# Patient Record
Sex: Female | Born: 1945 | Race: White | Hispanic: No | Marital: Single | Smoking: Never smoker
Health system: Southern US, Community
[De-identification: ages and names within clinical notes are randomized; demographics above are authoritative.]

## PROBLEM LIST (undated history)

## (undated) DIAGNOSIS — E7800 Pure hypercholesterolemia, unspecified: Secondary | ICD-10-CM

## (undated) DIAGNOSIS — R112 Nausea with vomiting, unspecified: Secondary | ICD-10-CM

## (undated) DIAGNOSIS — Z9889 Other specified postprocedural states: Secondary | ICD-10-CM

## (undated) HISTORY — PX: EYE SURGERY: SHX253

## (undated) HISTORY — PX: OVARIAN CYST SURGERY: SHX726

## (undated) HISTORY — PX: COLONOSCOPY: SHX174

---

## 1999-04-11 ENCOUNTER — Other Ambulatory Visit: Admission: RE | Admit: 1999-04-11 | Discharge: 1999-04-11 | Payer: Self-pay | Admitting: Obstetrics and Gynecology

## 1999-08-01 ENCOUNTER — Encounter (INDEPENDENT_AMBULATORY_CARE_PROVIDER_SITE_OTHER): Payer: Self-pay | Admitting: Specialist

## 1999-08-01 ENCOUNTER — Ambulatory Visit (HOSPITAL_COMMUNITY): Admission: RE | Admit: 1999-08-01 | Discharge: 1999-08-01 | Payer: Self-pay | Admitting: Gastroenterology

## 2000-05-08 ENCOUNTER — Other Ambulatory Visit: Admission: RE | Admit: 2000-05-08 | Discharge: 2000-05-08 | Payer: Self-pay | Admitting: Obstetrics and Gynecology

## 2004-06-06 ENCOUNTER — Other Ambulatory Visit: Admission: RE | Admit: 2004-06-06 | Discharge: 2004-06-06 | Payer: Self-pay | Admitting: Obstetrics and Gynecology

## 2004-07-05 ENCOUNTER — Ambulatory Visit (HOSPITAL_COMMUNITY): Admission: RE | Admit: 2004-07-05 | Discharge: 2004-07-05 | Payer: Self-pay | Admitting: Gastroenterology

## 2006-06-16 ENCOUNTER — Other Ambulatory Visit: Admission: RE | Admit: 2006-06-16 | Discharge: 2006-06-16 | Payer: Self-pay | Admitting: Obstetrics and Gynecology

## 2008-06-30 ENCOUNTER — Other Ambulatory Visit: Admission: RE | Admit: 2008-06-30 | Discharge: 2008-06-30 | Payer: Self-pay | Admitting: Obstetrics and Gynecology

## 2009-07-17 ENCOUNTER — Other Ambulatory Visit: Admission: RE | Admit: 2009-07-17 | Discharge: 2009-07-17 | Payer: Self-pay | Admitting: Obstetrics and Gynecology

## 2010-07-09 ENCOUNTER — Emergency Department (HOSPITAL_COMMUNITY): Admission: EM | Admit: 2010-07-09 | Discharge: 2010-07-09 | Payer: Self-pay | Admitting: Emergency Medicine

## 2010-07-17 ENCOUNTER — Other Ambulatory Visit: Admission: RE | Admit: 2010-07-17 | Discharge: 2010-07-17 | Payer: Self-pay | Admitting: Obstetrics and Gynecology

## 2011-02-15 LAB — COMPREHENSIVE METABOLIC PANEL
AST: 26 U/L (ref 0–37)
CO2: 28 mEq/L (ref 19–32)
Calcium: 9.4 mg/dL (ref 8.4–10.5)
Creatinine, Ser: 0.65 mg/dL (ref 0.4–1.2)
GFR calc Af Amer: >60 mL/min (ref >60)
GFR calc non Af Amer: >60 mL/min (ref >60)
Glucose, Bld: 97 mg/dL (ref 70–99)

## 2011-02-15 LAB — CK TOTAL AND CKMB (NOT AT ARMC)
CK, MB: 3 ng/mL (ref 0.3–4.0)
Total CK: 102 U/L (ref 7–177)

## 2011-02-15 LAB — CBC
Hemoglobin: 14.4 g/dL (ref 12.0–15.0)
MCHC: 34.6 g/dL (ref 30.0–36.0)
RBC: 4.62 MIL/uL (ref 3.87–5.11)
WBC: 5.3 10*3/uL (ref 4.0–10.5)

## 2011-02-15 LAB — DIFFERENTIAL
Basophils Absolute: 0.1 10*3/uL (ref 0.0–0.1)
Basophils Relative: 1 % (ref 0–1)
Monocytes Absolute: 0.4 10*3/uL (ref 0.1–1.0)
Neutro Abs: 3.1 10*3/uL (ref 1.7–7.7)

## 2011-04-19 NOTE — Op Note (Signed)
NAME:  Margaret Fleming, Margaret Fleming                            ACCOUNT NO.:  [id number]   MEDICAL RECORD NO.:  [id number]                   PATIENT TYPE:  AMB   LOCATION:  ENDO                                 FACILITY:  [facility]   PHYSICIAN:  Bernette Redbird, M.D.                DATE OF BIRTH:  October 07, 1946   DATE OF PROCEDURE:  07/05/2004  DATE OF DISCHARGE:                                 OPERATIVE REPORT   PROCEDURE:  Colonoscopy.   INDICATIONS:  Prior history of diminutive adenomatous polyp, having been  removed from this patient's colon about 5 years ago.   FINDINGS:  Normal examination.  Slightly poor prep.   DESCRIPTION OF PROCEDURE:  The nature, purpose and risks of the procedure  were familiar to the patient from prior examination.  She provided written  consent.  Sedation is fentanyl 60 mcg and Versed 7.5 mg IV, without  arrhythmias or desaturation.   The Olympus adjustable-tension pediatric video colonoscope was advanced with  mild difficulty due to looping, overcome by external abdominal compression  with the patient in the supine position.  The cecum was reached, as  evidenced by typical cecal appearance and the absence of further lumen.  Pullback was then performed.   In the proximal ascending colon, there was a fair amount of semiformed  vegetable debris, issuing forth from the ileocecal valve and into the cecum  and proximal colon.  This could not readily be suctioned up because of a  clogged scope.  I examined the proximal colon, then turned the patient back  into the left lateral decubitus position to shift the puddle, and re-examine  the area without findings.  Pullback was then performed.  There were a few  other puddles here and there in the colon, that could not readily be  suctioned up in their entirety, but it is felt that it is unlikely any large  or significant lesions would have been missed during this examination.  Retroflexion of the rectum and pullout through the anal  canal were  unremarkable.  No polyps, cancer, colitis, vascular malformations or  diverticulosis were noted; and, no biopsies were obtained.   The patient tolerated the procedure well and there were no apparent  complications.   IMPRESSION:  Prior history of colonic adenoma, without worrisome findings on  current examination.   PLAN:  Follow-up colonoscopy in 5 years.                                               Bernette Redbird, M.D.    RB/MEDQ  D:  07/05/2004  T:  07/06/2004  Job:  [id number]   cc:   Lilla Shook, M.D.  [address], [address]  [place]  [place] [address]  Fax: [phone]  Artist Pais, M.D.  [address], [address]  [place]  [place] [address]  Fax: [phone]

## 2012-11-04 ENCOUNTER — Other Ambulatory Visit: Payer: Self-pay | Admitting: Pharmacist

## 2013-07-01 ENCOUNTER — Other Ambulatory Visit (HOSPITAL_COMMUNITY)
Admission: RE | Admit: 2013-07-01 | Discharge: 2013-07-01 | Disposition: A | Discharge status: Home / Self Care | Payer: BC Managed Care – PPO | Source: Ambulatory Visit | Attending: Obstetrics and Gynecology | Admitting: Obstetrics and Gynecology

## 2013-07-01 ENCOUNTER — Other Ambulatory Visit: Payer: Self-pay | Admitting: Nurse Practitioner

## 2013-07-01 DIAGNOSIS — Z01419 Encounter for gynecological examination (general) (routine) without abnormal findings: Secondary | ICD-10-CM | POA: Insufficient documentation

## 2013-07-01 DIAGNOSIS — Z1151 Encounter for screening for human papillomavirus (HPV): Secondary | ICD-10-CM | POA: Insufficient documentation

## 2015-12-10 ENCOUNTER — Emergency Department (HOSPITAL_COMMUNITY): Payer: BC Managed Care – PPO

## 2015-12-10 ENCOUNTER — Observation Stay (HOSPITAL_COMMUNITY)
Admission: EM | Admit: 2015-12-10 | Discharge: 2015-12-12 | Disposition: A | Discharge status: Home / Self Care | Payer: BC Managed Care – PPO | Attending: Internal Medicine | Admitting: Internal Medicine

## 2015-12-10 ENCOUNTER — Encounter (HOSPITAL_COMMUNITY): Payer: Self-pay

## 2015-12-10 DIAGNOSIS — W000XXA Fall on same level due to ice and snow, initial encounter: Secondary | ICD-10-CM | POA: Insufficient documentation

## 2015-12-10 DIAGNOSIS — M25511 Pain in right shoulder: Secondary | ICD-10-CM | POA: Diagnosis present

## 2015-12-10 DIAGNOSIS — E785 Hyperlipidemia, unspecified: Secondary | ICD-10-CM | POA: Diagnosis not present

## 2015-12-10 DIAGNOSIS — S42231A 3-part fracture of surgical neck of right humerus, initial encounter for closed fracture: Secondary | ICD-10-CM | POA: Diagnosis not present

## 2015-12-10 DIAGNOSIS — S42309A Unspecified fracture of shaft of humerus, unspecified arm, initial encounter for closed fracture: Secondary | ICD-10-CM | POA: Diagnosis present

## 2015-12-10 DIAGNOSIS — Z79899 Other long term (current) drug therapy: Secondary | ICD-10-CM | POA: Insufficient documentation

## 2015-12-10 DIAGNOSIS — I10 Essential (primary) hypertension: Secondary | ICD-10-CM | POA: Insufficient documentation

## 2015-12-10 DIAGNOSIS — S4291XA Fracture of right shoulder girdle, part unspecified, initial encounter for closed fracture: Secondary | ICD-10-CM

## 2015-12-10 DIAGNOSIS — S4290XA Fracture of unspecified shoulder girdle, part unspecified, initial encounter for closed fracture: Secondary | ICD-10-CM | POA: Diagnosis present

## 2015-12-10 LAB — CBC
HCT: 38.2 % (ref 36.0–46.0)
Hemoglobin: 12.9 g/dL (ref 12.0–15.0)
MCH: 30.5 pg (ref 26.0–34.0)
MCHC: 33.8 g/dL (ref 30.0–36.0)
MCV: 90.3 fL (ref 78.0–100.0)
PLATELETS: 167 10*3/uL (ref 150–400)
RBC: 4.23 MIL/uL (ref 3.87–5.11)
RDW: 12.6 % (ref 11.5–15.5)
WBC: 11.1 10*3/uL — AB (ref 4.0–10.5)

## 2015-12-10 LAB — CREATININE, SERUM: Creatinine, Ser: 0.61 mg/dL (ref 0.44–1.00)

## 2015-12-10 LAB — TSH: TSH: 0.9 u[IU]/mL (ref 0.350–4.500)

## 2015-12-10 MED ORDER — ENOXAPARIN SODIUM 40 MG/0.4ML ~~LOC~~ SOLN
40.0000 mg | SUBCUTANEOUS | Status: DC
  Administered 2015-12-10 – 2015-12-11 (×2): 40 mg via SUBCUTANEOUS
  Filled 2015-12-10 (×3): qty 0.4

## 2015-12-10 MED ORDER — OXYCODONE HCL 5 MG PO TABS
5.0000 mg | ORAL_TABLET | ORAL | Status: DC | PRN
  Administered 2015-12-10 (×2): 5 mg via ORAL
  Filled 2015-12-10 (×2): qty 1

## 2015-12-10 MED ORDER — HYDROMORPHONE HCL 1 MG/ML IJ SOLN
0.5000 mg | INTRAMUSCULAR | Status: DC | PRN
  Administered 2015-12-10: 0.5 mg via INTRAVENOUS
  Filled 2015-12-10: qty 1

## 2015-12-10 MED ORDER — HYDROMORPHONE HCL 1 MG/ML IJ SOLN: 1.0000 mg | INTRAMUSCULAR | Status: DC | PRN

## 2015-12-10 MED ORDER — HYDROMORPHONE HCL 1 MG/ML IJ SOLN: 0.5000 mg | INTRAMUSCULAR | Status: DC | PRN

## 2015-12-10 MED ORDER — IBUPROFEN 200 MG PO TABS
600.0000 mg | ORAL_TABLET | Freq: Once | ORAL | Status: AC
  Administered 2015-12-10: 600 mg via ORAL
  Filled 2015-12-10: qty 3

## 2015-12-10 MED ORDER — ONDANSETRON HCL 4 MG/2ML IJ SOLN: 4.0000 mg | Freq: Four times a day (QID) | INTRAMUSCULAR | Status: DC | PRN

## 2015-12-10 MED ORDER — ONDANSETRON HCL 4 MG PO TABS: 4.0000 mg | ORAL_TABLET | Freq: Four times a day (QID) | ORAL | Status: DC | PRN

## 2015-12-10 NOTE — ED Provider Notes (Signed)
CSN: [id number]     Arrival date & time 12/10/15  0907 History   First MD Initiated Contact with Patient 12/10/15 [phone]     Chief Complaint  Patient presents with  . Fall  . Shoulder Pain     HPI Patient fell on ice today and is now c/o right shoulder pain. History reviewed. No pertinent past medical history. History reviewed. No pertinent past surgical history. Family History  Problem Relation Age of Onset  . Heart failure Father    Social History  Substance Use Topics  . Smoking status: Never Smoker   . Smokeless tobacco: Never Used  . Alcohol Use: Yes     Comment: occasionally   OB History    No data available     Review of Systems  All other systems reviewed and are negative.     Allergies  Review of patient's allergies indicates no known allergies.  Home Medications   Prior to Admission medications   Medication Sig Start Date End Date Taking? Authorizing Provider  atorvastatin (LIPITOR) 40 MG tablet Take 40 mg by mouth every evening. 10/09/15  Yes Historical Provider, MD  ibuprofen (ADVIL,MOTRIN) 200 MG tablet Take 200-400 mg by mouth every 6 (six) hours as needed (pain).   Yes Historical Provider, MD  Multiple Vitamin (MULTIVITAMIN WITH MINERALS) TABS tablet Take 1 tablet by mouth daily.   Yes Historical Provider, MD   BP 127/72 mmHg  Pulse 68  Temp(Src) 97.8 F (36.6 C) (Oral)  Resp 16  Ht 5\' 4"  (1.626 m)  Wt 125 lb (56.7 kg)  BMI 21.45 kg/m2  SpO2 100% Physical Exam  Constitutional: She is oriented to person, place, and time. She appears well-developed and well-nourished. No distress.  HENT:  Head: Normocephalic and atraumatic.  Eyes: Pupils are equal, round, and reactive to light.  Neck: Normal range of motion.  Cardiovascular: Normal rate and intact distal pulses.   Pulmonary/Chest: No respiratory distress.  Abdominal: Normal appearance. She exhibits no distension.  Musculoskeletal:       Right shoulder: She exhibits decreased range of motion,  tenderness and pain. She exhibits no bony tenderness, no deformity, normal pulse and normal strength.  Neurological: She is alert and oriented to person, place, and time. No cranial nerve deficit.  Skin: Skin is warm and dry. No rash noted.  Psychiatric: She has a normal mood and affect. Her behavior is normal.  Nursing note and vitals reviewed.   ED Course  Procedures (including critical care time) Labs Review Labs Reviewed - No data to display  Imaging Review Dg Shoulder Right  12/10/2015  CLINICAL DATA:  70 year old female with right proximal humerus pain following a fall on the ice EXAM: RIGHT SHOULDER - 2+ VIEW COMPARISON:  None. FINDINGS: Acute comminuted fracture through the surgical neck of the humerus.a the humeral head is fragmented into at least 3 segments. The humeral head remains located with respect to the glenoid. The visualized thorax is unremarkable. Normal bony mineralization. No lytic or blastic osseous lesion. IMPRESSION: 1. Acute and mildly displaced 3 part fracture through the surgical neck of the humerus. 2. Concern for spiral component of the fracture extending down the proximal humeral diaphysis. Electronically Signed   By: Malachy MoanHeath  McCullough M.D.   On: 12/10/2015 10:02   Dg Humerus Right  12/10/2015  CLINICAL DATA:  70 year old female with right proximal arm pain after falling in this known EXAM: RIGHT HUMERUS - 2+ VIEW COMPARISON:  Concurrently obtained radiographs of the shoulder FINDINGS: Acute mildly  displaced 3 part fracture of the proximal humerus through the surgical neck of the humerus. A lucency extends in a spiral configuration into the proximal humeral diaphysis consistent with extension of the primary fracture site. Bony mineralization appears to be within normal limits. No lytic or blastic osseous lesion. The visualized thorax is unremarkable. IMPRESSION: Acute mildly displaced 3 part fracture the proximal humerus through the surgical neck with extension of the  fracture line into the proximal humeral diaphysis Electronically Signed   By: Malachy Moan M.D.   On: 12/10/2015 10:05   I have personally reviewed and evaluated these images and lab results as part of my medical decision-making.    MDM   Final diagnoses:  Shoulder fracture, right, closed, initial encounter        Margaret Nay, MD 12/14/15 1800

## 2015-12-10 NOTE — ED Notes (Signed)
PATIENT CAN GO TO FLOOR AT 1252.

## 2015-12-10 NOTE — H&P (Addendum)
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 Pt reports pain is constant and sharp, 7/10 in severity and worse when trying to move it. Pain is not radiating and there are no specific alleviating or aggravating factors. Pt denies similar events in the past. Pt denies specific associated symptoms such as numbness or tingling, no loss of sensations. Pt also denies fevers, chills,   In ED, vital signs stable, blood work unremarkable, imaging studies notable for acute and mildly displaced fracture through the surgical neck of the humerus.   Assessment and Plan: Active Problems:   Shoulder fracture, surgical neck of the humerus, right - Dr. Radford PaxBeaton discussed with ortho on call, no acute need for surgical intervention - admit pt for pain control  - medical bed is appropriate - OT eval will be requested     Essential HTN - continue home medical regimen   Lovenox SQ for DVT prophylaxis   Radiological Exams on Admission: Dg Shoulder Right 12/10/2015  1. Acute and mildly displaced 3 part fracture through the surgical neck of the humerus. 2. Concern for spiral component of the fracture extending down the proximal humeral diaphysis.  Dg Humerus Right 12/10/2015  Acute mildly displaced 3 part fracture the proximal humerus through the surgical neck with extension of the fracture line into the proximal humeral diaphysis    Code Status: Full Family Communication: Pt at bedside Disposition Plan: Admit for further evaluation    Danie Binderskra Sylina Henion [facility] [phone]   Review of Systems:  Constitutional: Negative for fever, chills and malaise/fatigue. Negative for diaphoresis.  HENT: Negative  for hearing loss, ear pain, nosebleeds, congestion, sore throat, neck pain, tinnitus and ear discharge.   Eyes: Negative for blurred vision, double vision, photophobia, pain, discharge and redness.  Respiratory: Negative for cough, hemoptysis, sputum production, shortness of breath, wheezing and stridor.   Cardiovascular: Negative for chest pain, palpitations, orthopnea, claudication and leg swelling.  Gastrointestinal: Negative for nausea, vomiting and abdominal pain. Negative for heartburn, constipation, blood in stool and melena.  Genitourinary: Negative for dysuria, urgency, frequency, hematuria and flank pain.  Musculoskeletal: Negative for myalgias, back pain.  Skin: Negative for itching and rash.  Neurological: Negative for dizziness and weakness.  Endo/Heme/Allergies: Negative for environmental allergies and polydipsia. Does not bruise/bleed easily.  Psychiatric/Behavioral: Negative for suicidal ideas. The patient is not nervous/anxious.      Pt reports PMH of HTN and HLD  Social History:  reports that she has never smoked. She has never used smokeless tobacco. She reports that she drinks alcohol. She reports that she does not use illicit drugs.  No Known Allergies  Family History  Problem Relation Age of Onset  . Heart failure Father     Prior to Admission medications   Medication Sig Start Date End Date Taking? Authorizing Provider  atorvastatin (LIPITOR) 40 MG tablet Take 40 mg by mouth every evening. 10/09/15  Yes Historical Provider, MD  ibuprofen (ADVIL,MOTRIN) 200 MG tablet Take 200-400 mg by mouth every 6 (six) hours as needed (pain).   Yes Historical Provider, MD  Multiple Vitamin (MULTIVITAMIN WITH MINERALS) TABS tablet Take 1 tablet by mouth daily.   Yes Historical Provider, MD  Physical Exam: Filed Vitals:   12/10/15 0912 12/10/15 1146  BP: 100/55 127/72  Pulse: 62 68  Temp: 97.8 F (36.6 C)   TempSrc: Oral   Resp: 20 16  Height: 5\' 4"  (1.626 m)   Weight:  56.7 kg (125 lb)   SpO2: 100% 100%    Physical Exam  Constitutional: Appears well-developed and well-nourished. No distress.  HENT: Normocephalic. External right and left ear normal. Oropharynx is clear and moist.  Eyes: Conjunctivae and EOM are normal. PERRLA, no scleral icterus.  Neck: Normal ROM. Neck supple. No JVD. No tracheal deviation. No thyromegaly.  CVS: RRR, S1/S2 +, no murmurs, no gallops, no carotid bruit.  Pulmonary: Effort and breath sounds normal, no stridor, rhonchi, wheezes, rales.  Abdominal: Soft. BS +,  no distension, tenderness, rebound or guarding.  Musculoskeletal: Normal range of motion except right shoulder, TTP in that area  Lymphadenopathy: No lymphadenopathy noted, cervical, inguinal. Neuro: Alert. Normal reflexes, muscle tone coordination. No cranial nerve deficit. Skin: Skin is warm and dry. No rash noted. Not diaphoretic. No erythema. No pallor.  Psychiatric: Normal mood and affect. Behavior, judgment, thought content normal.   Labs on Admission:  Basic Metabolic Panel: No results for input(s): NA, K, CL, CO2, GLUCOSE, BUN, CREATININE, CALCIUM, MG, PHOS in the last 168 hours. Liver Function Tests: No results for input(s): AST, ALT, ALKPHOS, BILITOT, PROT, ALBUMIN in the last 168 hours. No results for input(s): LIPASE, AMYLASE in the last 168 hours. No results for input(s): AMMONIA in the last 168 hours. CBC: No results for input(s): WBC, NEUTROABS, HGB, HCT, MCV, PLT in the last 168 hours. Cardiac Enzymes: No results for input(s): CKTOTAL, CKMB, CKMBINDEX, TROPONINI in the last 168 hours. BNP: Invalid input(s): POCBNP CBG: No results for input(s): GLUCAP in the last 168 hours.  EKG: pending    If 7PM-7AM, please contact night-coverage [email or web address] Password [facility] 12/10/2015, 12:55 PM

## 2015-12-10 NOTE — ED Notes (Signed)
Patient fell on ice today and is now c/o right shoulder pain.

## 2015-12-10 NOTE — ED Notes (Signed)
Patient transported to X-ray 

## 2015-12-11 DIAGNOSIS — S4291XS Fracture of right shoulder girdle, part unspecified, sequela: Secondary | ICD-10-CM | POA: Diagnosis not present

## 2015-12-11 DIAGNOSIS — I10 Essential (primary) hypertension: Secondary | ICD-10-CM | POA: Diagnosis not present

## 2015-12-11 DIAGNOSIS — S4291XD Fracture of right shoulder girdle, part unspecified, subsequent encounter for fracture with routine healing: Secondary | ICD-10-CM

## 2015-12-11 DIAGNOSIS — S42301A Unspecified fracture of shaft of humerus, right arm, initial encounter for closed fracture: Secondary | ICD-10-CM

## 2015-12-11 DIAGNOSIS — E785 Hyperlipidemia, unspecified: Secondary | ICD-10-CM | POA: Diagnosis not present

## 2015-12-11 LAB — CBC
HEMATOCRIT: 36.4 % (ref 36.0–46.0)
HEMOGLOBIN: 12.5 g/dL (ref 12.0–15.0)
MCH: 31.4 pg (ref 26.0–34.0)
MCHC: 34.3 g/dL (ref 30.0–36.0)
MCV: 91.5 fL (ref 78.0–100.0)
Platelets: 167 10*3/uL (ref 150–400)
RBC: 3.98 MIL/uL (ref 3.87–5.11)
RDW: 12.9 % (ref 11.5–15.5)
WBC: 7.7 10*3/uL (ref 4.0–10.5)

## 2015-12-11 LAB — BASIC METABOLIC PANEL
ANION GAP: 6 (ref 5–15)
BUN: 12 mg/dL (ref 6–20)
CALCIUM: 9.2 mg/dL (ref 8.9–10.3)
CO2: 27 mmol/L (ref 22–32)
Chloride: 103 mmol/L (ref 101–111)
Creatinine, Ser: 0.57 mg/dL (ref 0.44–1.00)
GFR calc Af Amer: >60 mL/min (ref >60)
GLUCOSE: 131 mg/dL — AB (ref 65–99)
POTASSIUM: 3.9 mmol/L (ref 3.5–5.1)
SODIUM: 136 mmol/L (ref 135–145)

## 2015-12-11 NOTE — Progress Notes (Signed)
PT Cancellation Note  Patient Details Name: Margaret Fleming MRN: [id number] DOB: 07/16/1946   Cancelled Treatment:    Reason Eval/Treat Not Completed: PT screened, no needs identified, will sign off   Rada HayHill, Wynell Halberg Elizabeth 12/11/2015, 11:28 AM

## 2015-12-11 NOTE — Consult Note (Signed)
Reason for Consult:  Right proximal humerus fracture Referring Physician: Audie Pinto, MD/EDP  Margaret Fleming is an 70 y.o. female.  HPI:   70 yo female who sustained a right proximal humerus fracture after a slip on the ice.  She is right-handed and lives alone.  She does report significant right shoulder pain, but denies other injuries.  She denies right hand weakness or numbness.  History reviewed. No pertinent past medical history.  History reviewed. No pertinent past surgical history.  Family History  Problem Relation Age of Onset  . Heart failure Father     Social History:  reports that she has never smoked. She has never used smokeless tobacco. She reports that she drinks alcohol. She reports that she does not use illicit drugs.  Allergies: No Known Allergies  Medications: I have reviewed the patient's current medications.  Results for orders placed or performed during the hospital encounter of 12/10/15 (from the past 48 hour(s))  CBC     Status: Abnormal   Collection Time: 12/10/15  3:44 PM  Result Value Ref Range   WBC 11.1 (H) 4.0 - 10.5 K/uL   RBC 4.23 3.87 - 5.11 MIL/uL   Hemoglobin 12.9 12.0 - 15.0 g/dL   HCT 38.2 36.0 - 46.0 %   MCV 90.3 78.0 - 100.0 fL   MCH 30.5 26.0 - 34.0 pg   MCHC 33.8 30.0 - 36.0 g/dL   RDW 12.6 11.5 - 15.5 %   Platelets 167 150 - 400 K/uL  Creatinine, serum     Status: None   Collection Time: 12/10/15  3:44 PM  Result Value Ref Range   Creatinine, Ser 0.61 0.44 - 1.00 mg/dL   GFR calc non Af Amer >60 >60 mL/min   GFR calc Af Amer >60 >60 mL/min    Comment: (NOTE) The eGFR has been calculated using the CKD EPI equation. This calculation has not been validated in all clinical situations. eGFR's persistently <60 mL/min signify possible Chronic Kidney Disease.   TSH     Status: None   Collection Time: 12/10/15  3:44 PM  Result Value Ref Range   TSH 0.900 0.350 - 4.500 uIU/mL  Basic metabolic panel     Status: Abnormal   Collection Time:  12/11/15  5:14 AM  Result Value Ref Range   Sodium 136 135 - 145 mmol/L   Potassium 3.9 3.5 - 5.1 mmol/L   Chloride 103 101 - 111 mmol/L   CO2 27 22 - 32 mmol/L   Glucose, Bld 131 (H) 65 - 99 mg/dL   BUN 12 6 - 20 mg/dL   Creatinine, Ser 0.57 0.44 - 1.00 mg/dL   Calcium 9.2 8.9 - 10.3 mg/dL   GFR calc non Af Amer >60 >60 mL/min   GFR calc Af Amer >60 >60 mL/min    Comment: (NOTE) The eGFR has been calculated using the CKD EPI equation. This calculation has not been validated in all clinical situations. eGFR's persistently <60 mL/min signify possible Chronic Kidney Disease.    Anion gap 6 5 - 15  CBC     Status: None   Collection Time: 12/11/15  5:14 AM  Result Value Ref Range   WBC 7.7 4.0 - 10.5 K/uL   RBC 3.98 3.87 - 5.11 MIL/uL   Hemoglobin 12.5 12.0 - 15.0 g/dL   HCT 36.4 36.0 - 46.0 %   MCV 91.5 78.0 - 100.0 fL   MCH 31.4 26.0 - 34.0 pg   MCHC 34.3 30.0 -  36.0 g/dL   RDW 12.9 11.5 - 15.5 %   Platelets 167 150 - 400 K/uL    Dg Shoulder Right  12/10/2015  CLINICAL DATA:  70 year old female with right proximal humerus pain following a fall on the ice EXAM: RIGHT SHOULDER - 2+ VIEW COMPARISON:  None. FINDINGS: Acute comminuted fracture through the surgical neck of the humerus.a the humeral head is fragmented into at least 3 segments. The humeral head remains located with respect to the glenoid. The visualized thorax is unremarkable. Normal bony mineralization. No lytic or blastic osseous lesion. IMPRESSION: 1. Acute and mildly displaced 3 part fracture through the surgical neck of the humerus. 2. Concern for spiral component of the fracture extending down the proximal humeral diaphysis. Electronically Signed   By: Jacqulynn Cadet M.D.   On: 12/10/2015 10:02   Dg Humerus Right  12/10/2015  CLINICAL DATA:  70 year old female with right proximal arm pain after falling in this known EXAM: RIGHT HUMERUS - 2+ VIEW COMPARISON:  Concurrently obtained radiographs of the shoulder  FINDINGS: Acute mildly displaced 3 part fracture of the proximal humerus through the surgical neck of the humerus. A lucency extends in a spiral configuration into the proximal humeral diaphysis consistent with extension of the primary fracture site. Bony mineralization appears to be within normal limits. No lytic or blastic osseous lesion. The visualized thorax is unremarkable. IMPRESSION: Acute mildly displaced 3 part fracture the proximal humerus through the surgical neck with extension of the fracture line into the proximal humeral diaphysis Electronically Signed   By: Jacqulynn Cadet M.D.   On: 12/10/2015 10:05    Review of Systems  All other systems reviewed and are negative.  Blood pressure 108/56, pulse 77, temperature 97.9 F (36.6 C), temperature source Oral, resp. rate 16, height 5' 4"  (1.626 m), weight 56.7 kg (125 lb), SpO2 97 %. Physical Exam  Constitutional: She is oriented to person, place, and time. She appears well-developed and well-nourished.  HENT:  Head: Normocephalic.  Eyes: Pupils are equal, round, and reactive to light.  Neck: Normal range of motion.  Cardiovascular: Normal rate and regular rhythm.   Respiratory: Effort normal and breath sounds normal.  GI: Soft. Bowel sounds are normal.  Musculoskeletal:       Right shoulder: She exhibits decreased range of motion, tenderness, bony tenderness and swelling.  Neurological: She is alert and oriented to person, place, and time.  Skin: Skin is warm and dry.  Psychiatric: She has a normal mood and affect.    Assessment/Plan: Right shoulder with displaced proximal humerus fracture 1)  Given her relative young age and the fact that she lives alone and this being her dominant arm, I have recommended surgery.  This would involve open reduction/internal fixation with a periarticular plate and screws.  With surgery, early mobility can be attempted.  She needs OT for now for ADL's with no shoulder motion and no weight bearing  on her right arm.  She can move her elbow, wrist and hand as comfort allows.  Due to her swelling and the surgery schedule, I will plan on surgery late this Friday afternoon.  [name] 12/11/2015, 8:29 AM

## 2015-12-11 NOTE — Progress Notes (Signed)
Occupational Therapy Evaluation Patient Details Name: Margaret Fleming MRN: [id number] DOB: 04-27-1946 Today's Date: 12/11/2015    History of Present Illness 70 yo female who sustained a right proximal humerus fracture after a slip on the ice. She is right-handed and lives alone.   Clinical Impression   Patient presents to OT with decreased ADL independence due to above diagnosis with below functional limitations. She will benefit from skilled OT to maximize independence. OT will follow.    Follow Up Recommendations  No OT follow up;Supervision - Intermittent    Equipment Recommendations  None recommended by OT    Recommendations for Other Services       Precautions / Restrictions Precautions Precautions: Fall Required Braces or Orthoses: Sling Restrictions Weight Bearing Restrictions: Yes RUE Weight Bearing: Non weight bearing Other Position/Activity Restrictions: no R shoulder ROM      Mobility Bed Mobility Overal bed mobility: Modified Independent                Transfers Overall transfer level: Modified independent Equipment used: None                  Balance                                            ADL Overall ADL's : Needs assistance/impaired Eating/Feeding: Set up;Bed level;Sitting   Grooming: Set up;Sitting;Bed level   Upper Body Bathing: Minimal assitance;Sitting   Lower Body Bathing: Minimal assistance;Sit to/from stand   Upper Body Dressing : Minimal assistance;Sitting   Lower Body Dressing: Minimal assistance;Sit to/from stand   Toilet Transfer: Modified Independent;Ambulation;Regular Toilet   Toileting- Clothing Manipulation and Hygiene: Modified independent;Sit to/from stand       Functional mobility during ADLs: Modified independent General ADL Comments: Patient given shoulder protocol handout for ADLs and instructed on techniques for ADLs, sling use, positoning, elbow/wrist/hand exercises, etc. Patient  verbalized understanding. She has nobody to assist her at home, and is currently min A with ADLs. She is modified independent, however, with toileting including ambulating to bathroom and can ambulate in hallway with modified independence. Her main challenges are don/doff sling, bathing, dressing. With additional practice, feel patient can progress to a modified independent level. OT will follow.     Vision     Perception     Praxis      Pertinent Vitals/Pain Pain Assessment: 0-10 Pain Score: 4  Pain Location: R shoulder/arm Pain Descriptors / Indicators: Aching;Dull Pain Intervention(s): Limited activity within patient's tolerance;Monitored during session;Repositioned;Ice applied     Hand Dominance Right   Extremity/Trunk Assessment Upper Extremity Assessment Upper Extremity Assessment: RUE deficits/detail RUE Deficits / Details: elbow/wrist/hand intact but shoulder NT due to MD order of no shoulder ROM   Lower Extremity Assessment Lower Extremity Assessment: Overall WFL for tasks assessed   Cervical / Trunk Assessment Cervical / Trunk Assessment: Normal   Communication Communication Communication: No difficulties   Cognition Arousal/Alertness: Awake/alert Behavior During Therapy: WFL for tasks assessed/performed Overall Cognitive Status: Within Functional Limits for tasks assessed                     General Comments       Exercises Exercises: Other exercises (elbow/wrist/hand AROM)     Shoulder Instructions      Home Living Family/patient expects to be discharged to:: Private residence Living Arrangements: Alone Available Help  at Discharge: Friend(s);Available PRN/intermittently Type of Home: House Home Access: Stairs to enter Entrance Stairs-Number of Steps: 2 Entrance Stairs-Rails: None Home Layout: Two level;Bed/bath upstairs Alternate Level Stairs-Number of Steps: flight   Bathroom Shower/Tub: Producer, television/film/videoWalk-in shower   Bathroom Toilet: Standard      Home Equipment: None          Prior Functioning/Environment Level of Independence: Independent        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[employer]    OT Diagnosis: Acute pain   OT Problem List: Decreased knowledge of precautions;Impaired UE functional use;Pain   OT Treatment/Interventions: Self-care/ADL training;Therapeutic exercise;DME and/or AE instruction;Therapeutic activities;Patient/family education    OT Goals(Current goals can be found in the care plan section) Acute Rehab OT Goals Patient Stated Goal: to go home tomorrow OT Goal Formulation: With patient Time For Goal Achievement: 12/18/15 Potential to Achieve Goals: Good  OT Frequency: Min 2X/week   Barriers to D/C: Decreased caregiver support          Co-evaluation              End of Session Equipment Utilized During Treatment: Other (comment) (sling)  Activity Tolerance: Patient tolerated treatment well Patient left: in bed;with call bell/phone within reach   Time: 1007-1032 OT Time Calculation (min): 25 min Charges:  OT General Charges $OT Visit: 1 Procedure OT Evaluation $OT Eval Low Complexity: 1 Procedure OT Treatments $Self Care/Home Management : 8-22 mins G-Codes: OT G-codes **NOT FOR INPATIENT CLASS** Functional Assessment Tool Used: clinical judgment Functional Limitation: Self care Self Care Current Status (W0981(G8987): At least 20 percent but less than 40 percent impaired, limited or restricted Self Care Goal Status (X9147(G8988): At least 1 percent but less than 20 percent impaired, limited or restricted  Tyrone Pautsch A 12/11/2015, 11:38 AM

## 2015-12-11 NOTE — Progress Notes (Signed)
Progress Note   Margaret GirtDiane L Fleming [id number] DOB: 11/01/1946 DOA: 12/10/2015 PCP: Gweneth DimitriMCNEILL,WENDY, MD   Brief Narrative:   Margaret Fleming is an 70 y.o. female who was admitted 12/10/15 with a right fracture of the humerus after suffering from a fall.  Assessment/Plan:   Principal Problem:   Humerus fracture - Admitted for pain control.  - Surgical repair with OR/IF planned on 12/15/15. - PT/OT evaluations. Patient lives alone and does not have any help at home.  Active Problems:   Essential hypertension - Not currently on antihypertensives. Monitor BP.    Hyperlipidemia - Continue Lipitor.    DVT Prophylaxis - Lovenox ordered.   Family Communication/Anticipated D/C date and plan/Code Status   Family Communication: No family at the bedside. Disposition Plan: Home when stable. Lives alone. Anticipated D/C date:   Depends on whether she can adequately care for herself at home. If so, she may be can go home and return for her surgery on 12/15/15. Code Status: Full code.   IV Access:    Peripheral IV   Procedures and diagnostic studies:   Dg Shoulder Right  12/10/2015  CLINICAL DATA:  70 year old female with right proximal humerus pain following a fall on the ice EXAM: RIGHT SHOULDER - 2+ VIEW COMPARISON:  None. FINDINGS: Acute comminuted fracture through the surgical neck of the humerus.a the humeral head is fragmented into at least 3 segments. The humeral head remains located with respect to the glenoid. The visualized thorax is unremarkable. Normal bony mineralization. No lytic or blastic osseous lesion. IMPRESSION: 1. Acute and mildly displaced 3 part fracture through the surgical neck of the humerus. 2. Concern for spiral component of the fracture extending down the proximal humeral diaphysis. Electronically Signed   By: Malachy MoanHeath  McCullough M.D.   On: 12/10/2015 10:02   Dg Humerus Right  12/10/2015  CLINICAL DATA:  70 year old female with right proximal arm pain after falling  in this known EXAM: RIGHT HUMERUS - 2+ VIEW COMPARISON:  Concurrently obtained radiographs of the shoulder FINDINGS: Acute mildly displaced 3 part fracture of the proximal humerus through the surgical neck of the humerus. A lucency extends in a spiral configuration into the proximal humeral diaphysis consistent with extension of the primary fracture site. Bony mineralization appears to be within normal limits. No lytic or blastic osseous lesion. The visualized thorax is unremarkable. IMPRESSION: Acute mildly displaced 3 part fracture the proximal humerus through the surgical neck with extension of the fracture line into the proximal humeral diaphysis Electronically Signed   By: Malachy MoanHeath  McCullough M.D.   On: 12/10/2015 10:05     Medical Consultants:    None.  Anti-Infectives:   Anti-infectives    None      Subjective:   Margaret Fleming continues to have shoulder pain, somewhat improved with pain medications and ice.    Objective:    Filed Vitals:   12/10/15 1500 12/10/15 1806 12/10/15 2217 12/11/15 0444  BP: 103/57 130/61 126/60 108/56  Pulse: 71 71 73 77  Temp: 97.9 F (36.6 C) 99 F (37.2 C) 98.1 F (36.7 C) 97.9 F (36.6 C)  TempSrc: Oral Oral Oral Oral  Resp: 18 16 17 16   Height:      Weight:      SpO2: 99% 98% 97% 97%    Intake/Output Summary (Last 24 hours) at 12/11/15 0850 Last data filed at 12/11/15 0654  Gross per 24 hour  Intake   1400 ml  Output  0 ml  Net   1400 ml   Filed Weights   12/10/15 0912  Weight: 56.7 kg (125 lb)    Exam: Gen:  NAD Cardiovascular:  RRR, No M/R/G Respiratory:  Lungs CTAB Gastrointestinal:  Abdomen soft, NT/ND, + BS Extremities:  No C/E/C, right shoulder with extensive bruising.   Data Reviewed:    Labs: Basic Metabolic Panel:  Recent Labs Lab 12/10/15 1544 12/11/15 0514  NA  --  136  K  --  3.9  CL  --  103  CO2  --  27  GLUCOSE  --  131*  BUN  --  12  CREATININE 0.61 0.57  CALCIUM  --  9.2    GFR Estimated Creatinine Clearance: 58.1 mL/min (by C-G formula based on Cr of 0.57). Liver Function Tests: No results for input(s): AST, ALT, ALKPHOS, BILITOT, PROT, ALBUMIN in the last 168 hours. No results for input(s): LIPASE, AMYLASE in the last 168 hours. No results for input(s): AMMONIA in the last 168 hours. Coagulation profile No results for input(s): INR, PROTIME in the last 168 hours.  CBC:  Recent Labs Lab 12/10/15 1544 12/11/15 0514  WBC 11.1* 7.7  HGB 12.9 12.5  HCT 38.2 36.4  MCV 90.3 91.5  PLT 167 167   Cardiac Enzymes: No results for input(s): CKTOTAL, CKMB, CKMBINDEX, TROPONINI in the last 168 hours. BNP (last 3 results) No results for input(s): PROBNP in the last 8760 hours. CBG: No results for input(s): GLUCAP in the last 168 hours. D-Dimer: No results for input(s): DDIMER in the last 72 hours. Hgb A1c: No results for input(s): HGBA1C in the last 72 hours. Lipid Profile: No results for input(s): CHOL, HDL, LDLCALC, TRIG, CHOLHDL, LDLDIRECT in the last 72 hours. Thyroid function studies:  Recent Labs  12/10/15 1544  TSH 0.900   Anemia work up: No results for input(s): VITAMINB12, FOLATE, FERRITIN, TIBC, IRON, RETICCTPCT in the last 72 hours. Sepsis Labs:  Recent Labs Lab 12/10/15 1544 12/11/15 0514  WBC 11.1* 7.7   Microbiology No results found for this or any previous visit (from the past 240 hour(s)).   Medications:   . enoxaparin (LOVENOX) injection  40 mg Subcutaneous Q24H   Continuous Infusions:   Time spent: 25 minutes.     Nixie Laube  [facility] Pager [phone]. If unable to reach me by pager, please call my cell phone at [phone].  *Please refer to [email or web address], password [email or web address] to get updated schedule on who will round on this patient, as hospitalists switch teams weekly. If 7PM-7AM, please contact night-coverage at [email or web address], password [email or web address] for any overnight needs.  12/11/2015, 8:50 AM

## 2015-12-12 ENCOUNTER — Other Ambulatory Visit (HOSPITAL_COMMUNITY): Payer: Self-pay | Admitting: Orthopaedic Surgery

## 2015-12-12 DIAGNOSIS — E785 Hyperlipidemia, unspecified: Secondary | ICD-10-CM | POA: Diagnosis present

## 2015-12-12 DIAGNOSIS — S4291XS Fracture of right shoulder girdle, part unspecified, sequela: Secondary | ICD-10-CM | POA: Diagnosis not present

## 2015-12-12 MED ORDER — OXYCODONE HCL 5 MG PO TABS: 5.0000 mg | ORAL_TABLET | ORAL | Status: AC | PRN

## 2015-12-12 NOTE — Discharge Instructions (Signed)
Ice intermittently throughout the day to your right shoulder for swelling. You can occasionally rest you arm out of your sling as comfort allows and move your elbow/wrist/hand. Someone from Dr. Eliberto IvoryBlackman's office with [facility] will be calling to schedule your surgery.

## 2015-12-12 NOTE — Progress Notes (Signed)
Patient ID: Margaret Fleming, female   DOB: 08/02/1946, 70 y.o.   MRN: [id number] Did well with therapy and doing better overall.  Can be discharged to home today.  I will schedule her for her definitive surgery this coming Friday.  We will contact her about the timing.

## 2015-12-12 NOTE — Discharge Summary (Signed)
Physician Discharge Summary  Margaret GirtDiane L Scarbro [id number] DOB: 09/19/1946 DOA: 12/10/2015  PCP: Gweneth DimitriMCNEILL,WENDY, MD  Admit date: 12/10/2015 Discharge date: 12/12/2015   Recommendations for Outpatient Follow-Up:   1. F/U with Dr. Magnus IvanBlackman on 12/15/15 for surgical repair.   Discharge Diagnosis:   Principal Problem:    Humerus fracture, right Active Problems:    Essential hypertension    Hyperlipidemia   Discharge disposition:  Home.    Discharge Condition: Improved.  Diet recommendation: Low sodium, heart healthy.    History of Present Illness:   Margaret Fleming is an 70 y.o. female who was admitted 12/10/15 with a right fracture of the humerus after suffering from a fall on the ice.   Hospital Course by Problem:   Principal Problem:  Humerus fracture - Admitted for pain control.  - Surgical repair with OR/IF planned on 12/15/15. - PT/OT evaluations performed.  No outpatient F/U needed.  Active Problems:  Essential hypertension - Not currently on antihypertensives. BP controlled.   Hyperlipidemia - Continue Lipitor.    Medical Consultants:    Orthopedic Surgery: Kathryne Hitchhristopher Y Blackman, MD   Discharge Exam:   Filed Vitals:   12/11/15 1410 12/11/15 2254  BP: 129/62 140/69  Pulse: 78 81  Temp: 97.6 F (36.4 C) 99.3 F (37.4 C)  Resp: 16 16   Filed Vitals:   12/10/15 2217 12/11/15 0444 12/11/15 1410 12/11/15 2254  BP: 126/60 108/56 129/62 140/69  Pulse: 73 77 78 81  Temp: 98.1 F (36.7 C) 97.9 F (36.6 C) 97.6 F (36.4 C) 99.3 F (37.4 C)  TempSrc: Oral Oral Oral Oral  Resp: 17 16 16 16   Height:      Weight:      SpO2: 97% 97% 98% 96%    Gen:  NAD Cardiovascular:  RRR, No M/R/G Respiratory: Lungs CTAB Gastrointestinal: Abdomen soft, NT/ND with normal active bowel sounds. Extremities: No C/E/C, right shoulder in sling   The results of significant diagnostics from this hospitalization (including imaging, microbiology, ancillary and  laboratory) are listed below for reference.     Procedures and Diagnostic Studies:   Dg Shoulder Right  12/10/2015  CLINICAL DATA:  70 year old female with right proximal humerus pain following a fall on the ice EXAM: RIGHT SHOULDER - 2+ VIEW COMPARISON:  None. FINDINGS: Acute comminuted fracture through the surgical neck of the humerus.a the humeral head is fragmented into at least 3 segments. The humeral head remains located with respect to the glenoid. The visualized thorax is unremarkable. Normal bony mineralization. No lytic or blastic osseous lesion. IMPRESSION: 1. Acute and mildly displaced 3 part fracture through the surgical neck of the humerus. 2. Concern for spiral component of the fracture extending down the proximal humeral diaphysis. Electronically Signed   By: Malachy MoanHeath  McCullough M.D.   On: 12/10/2015 10:02   Dg Humerus Right  12/10/2015  CLINICAL DATA:  70 year old female with right proximal arm pain after falling in this known EXAM: RIGHT HUMERUS - 2+ VIEW COMPARISON:  Concurrently obtained radiographs of the shoulder FINDINGS: Acute mildly displaced 3 part fracture of the proximal humerus through the surgical neck of the humerus. A lucency extends in a spiral configuration into the proximal humeral diaphysis consistent with extension of the primary fracture site. Bony mineralization appears to be within normal limits. No lytic or blastic osseous lesion. The visualized thorax is unremarkable. IMPRESSION: Acute mildly displaced 3 part fracture the proximal humerus through the surgical neck with extension of the fracture line into the proximal  humeral diaphysis Electronically Signed   By: Malachy Moan M.D.   On: 12/10/2015 10:05     Labs:   Basic Metabolic Panel:  Recent Labs Lab 12/10/15 1544 12/11/15 0514  NA  --  136  K  --  3.9  CL  --  103  CO2  --  27  GLUCOSE  --  131*  BUN  --  12  CREATININE 0.61 0.57  CALCIUM  --  9.2   GFR Estimated Creatinine Clearance: 58.1  mL/min (by C-G formula based on Cr of 0.57). Liver Function Tests: No results for input(s): AST, ALT, ALKPHOS, BILITOT, PROT, ALBUMIN in the last 168 hours. No results for input(s): LIPASE, AMYLASE in the last 168 hours. No results for input(s): AMMONIA in the last 168 hours. Coagulation profile No results for input(s): INR, PROTIME in the last 168 hours.  CBC:  Recent Labs Lab 12/10/15 1544 12/11/15 0514  WBC 11.1* 7.7  HGB 12.9 12.5  HCT 38.2 36.4  MCV 90.3 91.5  PLT 167 167   Cardiac Enzymes: No results for input(s): CKTOTAL, CKMB, CKMBINDEX, TROPONINI in the last 168 hours. BNP: Invalid input(s): POCBNP CBG: No results for input(s): GLUCAP in the last 168 hours. D-Dimer No results for input(s): DDIMER in the last 72 hours. Hgb A1c No results for input(s): HGBA1C in the last 72 hours. Lipid Profile No results for input(s): CHOL, HDL, LDLCALC, TRIG, CHOLHDL, LDLDIRECT in the last 72 hours. Thyroid function studies  Recent Labs  12/10/15 1544  TSH 0.900   Anemia work up No results for input(s): VITAMINB12, FOLATE, FERRITIN, TIBC, IRON, RETICCTPCT in the last 72 hours. Microbiology No results found for this or any previous visit (from the past 240 hour(s)).   Discharge Instructions:   Discharge Instructions    Call MD for:  severe uncontrolled pain    Complete by:  As directed      Diet - low sodium heart healthy    Complete by:  As directed      Increase activity slowly    Complete by:  As directed             Medication List    TAKE these medications        atorvastatin 40 MG tablet  Commonly known as:  LIPITOR  Take 40 mg by mouth every evening.     ibuprofen 200 MG tablet  Commonly known as:  ADVIL,MOTRIN  Take 200-400 mg by mouth every 6 (six) hours as needed (pain).     multivitamin with minerals Tabs tablet  Take 1 tablet by mouth daily.     oxyCODONE 5 MG immediate release tablet  Commonly known as:  Oxy IR/ROXICODONE  Take 1-2  tablets (5-10 mg total) by mouth every 4 (four) hours as needed for moderate pain or severe pain.           Follow-up Information    Follow up with Kathryne Hitch, MD. Schedule an appointment as soon as possible for a visit in 2 weeks.   Specialty:  Orthopedic Surgery   Contact information:   [address] [place] [address] [phone]       Schedule an appointment as soon as possible for a visit with [facility], MD.   Specialty:  Family Medicine   Why:  As needed   Contact information:   [address] [place] [address] [phone]        Time coordinating discharge: 25 minutes.  Signed:  Murphy Duzan  Pager [phone] [facility] 12/12/2015, 10:44 AM

## 2015-12-12 NOTE — Progress Notes (Signed)
Occupational Therapy Treatment and Discharge Patient Details Name: Margaret Fleming MRN: [id number] DOB: [birth year] Today's Date: 12/12/2015    History of present illness 70 yo female who sustained a right proximal humerus fracture after a slip on the ice. She is right-handed and lives alone.   OT comments  This 70 yo female admitted with above presents to acute OT at an overall Mod I level (she did need A for buttoning her jeans; however she plans on wearing elastic waist pants or just not buttoning her jeans). Acute OT will sign off.  Follow Up Recommendations  No OT follow up (prn A (more so for IADLs))    Equipment Recommendations  None recommended by OT       Precautions / Restrictions Precautions Precautions: Fall;Shoulder Shoulder Interventions: Shoulder sling/immobilizer;Off for dressing/bathing/exercises Required Braces or Orthoses: Sling Restrictions Weight Bearing Restrictions: Yes RUE Weight Bearing: Non weight bearing Other Position/Activity Restrictions: no R shoulder ROM       Mobility Bed Mobility Overal bed mobility: Modified Independent             General bed mobility comments: Pt plans on that she may sleep in recliner initially--she is aware of how position her arm in bed or recliner.  Transfers Overall transfer level: Modified independent Equipment used: None                  Balance Overall balance assessment: Needs assistance Sitting-balance support: No upper extremity supported;Feet supported Sitting balance-Leahy Scale: Good     Standing balance support: No upper extremity supported Standing balance-Leahy Scale: Good                     ADL Overall ADL's : Needs assistance/impaired     Grooming: Modified independent;Standing Grooming Details (indicate cue type and reason): Demonstrated to pt how she can lean to her right to wash under her right arm and to apply deodorant without actively moving her shoulder Upper Body  Bathing: Modified independent;Standing Upper Body Bathing Details (indicate cue type and reason): Demonstrated to pt how she can lean to her right to wash under her right arm and to apply deodorant without actively moving her shoulder--pt returned demo Lower Body Bathing: Modified independent;Sit to/from stand   Upper Body Dressing : Modified independent;Standing   Lower Body Dressing: Minimal assistance;Sit to/from stand Lower Body Dressing Details (indicate cue type and reason): only for button on jeans (pt will use elastice waist pants or will not button jean--only zipper) Toilet Transfer: Modified Independent;Ambulation;Regular Museum/gallery exhibitions officer and Hygiene: Minimal assistance Toileting - Clothing Manipulation Details (indicate cue type and reason): only for button on jeans (pt will use elastice waist pants or will not button jean--only zipper)       General ADL Comments: Pt doffed and donned her sling with S for cues on how to do it to make it easier                Cognition   Behavior During Therapy: [facility] for tasks assessed/performed Overall Cognitive Status: Within Functional Limits for tasks assessed                         Exercises Other Exercises Other Exercises: Pt aware that she can do elbow and distally exercises, but not to actively move the shoulder.           Pertinent Vitals/ Pain       Pain Assessment: 0-10  Pain Score: 3  Pain Location: right shoulder Pain Descriptors / Indicators: Aching;Sore Pain Intervention(s): Monitored during session;Repositioned;Ice applied         Frequency Min 2X/week     Progress Toward Goals  OT Goals(current goals can now be found in the care plan section)  Progress towards OT goals: Goals met/education completed, patient discharged from OT  ADL Goals Pt Will Perform Grooming: with modified independence;sitting;standing Pt Will Perform Upper Body Bathing: with modified  independence;sitting;standing Pt Will Perform Lower Body Bathing: with modified independence;sit to/from stand Pt Will Perform Upper Body Dressing: with modified independence;standing;sitting Pt Will Perform Lower Body Dressing: with modified independence;sit to/from stand Pt Will Transfer to Toilet: with modified independence;ambulating;regular height toilet Pt Will Perform Toileting - Clothing Manipulation and hygiene: with modified independence;sit to/from stand Pt/caregiver will Perform Home Exercise Program: Right Upper extremity;Independently;With written HEP provided  Plan Discharge plan remains appropriate       End of Session Equipment Utilized During Treatment:  (immoblizer sling)   Activity Tolerance Patient tolerated treatment well   Patient Left in chair;with call bell/phone within reach   Nurse Communication  (pt needs a medium immobilizer sling)    Functional Assessment Tool Used: clinical observation Functional Limitation: Self care Self Care Current Status (K4469): At least 1 percent but less than 20 percent impaired, limited or restricted Self Care Goal Status (F0722): At least 1 percent but less than 20 percent impaired, limited or restricted Self Care Discharge Status [phone]): At least 1 percent but less than 20 percent impaired, limited or restricted   Time: 0856-0930 OT Time Calculation (min): 34 min  Charges: OT G-codes **NOT FOR INPATIENT CLASS** Functional Assessment Tool Used: clinical observation Functional Limitation: Self care Self Care Current Status (X8335): At least 1 percent but less than 20 percent impaired, limited or restricted Self Care Goal Status (O2518): At least 1 percent but less than 20 percent impaired, limited or restricted Self Care Discharge Status [phone]): At least 1 percent but less than 20 percent impaired, limited or restricted OT General Charges $OT Visit: 1 Procedure OT Treatments $Self Care/Home Management : 23-37  mins  Almon Register [phone] 12/12/2015, 9:45 AM

## 2015-12-14 ENCOUNTER — Encounter (HOSPITAL_COMMUNITY): Payer: Self-pay | Admitting: General Practice

## 2015-12-14 MED ORDER — CEFAZOLIN SODIUM-DEXTROSE 2-3 GM-% IV SOLR
2.0000 g | INTRAVENOUS | Status: AC
  Administered 2015-12-15: 2 g via INTRAVENOUS
  Filled 2015-12-14: qty 50

## 2015-12-14 NOTE — Progress Notes (Signed)
PCP - Dr. Gweneth DimitriWendy McNeill at [facility] Cardiologist - denies  EKG/CXR - denies  Echo/stress test/cardiac cath - denies  Patient denies chest pain and shortness of breath on pre-op phone call.

## 2015-12-14 NOTE — Pre-Procedure Instructions (Signed)
    Margaret Fleming  12/14/2015      [facility] [address] - [place], [place] - [address] [place] [id number] Phone: [phone] Fax: [phone]    Your procedure is scheduled on Friday, January 13th, 2017.  [facility] at 4:30 P.M.  Call this number if you have problems the morning of surgery:  [phone]   Remember:  Per MD's instruction, do not eat food or drink liquids after breakfast day of surgery.   Take these medicines the morning of surgery with A SIP OF WATER: None.  Stop taking: Vitamins, Ibuprofen, Advil, Motrin, Aleve, Naproxen, Aspirin, NSAIDS, BC's, Goody's, fish oil, and all herbal medications.    Do not wear jewelry, make-up or nail polish.  Do not wear lotions, powders, or perfumes.  You may NOT wear deodorant.  Do not shave 48 hours prior to surgery.   Do not bring valuables to the hospital.  [facility] is not responsible for any belongings or valuables.  Contacts, dentures or bridgework may not be worn into surgery.  Leave your suitcase in the car.  After surgery it may be brought to your room.  For patients admitted to the hospital, discharge time will be determined by your treatment team.  Patients discharged the day of surgery will not be allowed to drive home.   Special instructions:  None.

## 2015-12-15 ENCOUNTER — Inpatient Hospital Stay (HOSPITAL_COMMUNITY): Payer: BC Managed Care – PPO

## 2015-12-15 ENCOUNTER — Encounter (HOSPITAL_COMMUNITY)
Admission: RE | Disposition: A | Discharge status: Home / Self Care | Payer: Self-pay | Source: Ambulatory Visit | Attending: Orthopaedic Surgery

## 2015-12-15 ENCOUNTER — Inpatient Hospital Stay (HOSPITAL_COMMUNITY): Payer: BC Managed Care – PPO | Admitting: Certified Registered"

## 2015-12-15 ENCOUNTER — Encounter (HOSPITAL_COMMUNITY): Payer: Self-pay | Admitting: General Practice

## 2015-12-15 ENCOUNTER — Observation Stay (HOSPITAL_COMMUNITY)
Admission: RE | Admit: 2015-12-15 | Discharge: 2015-12-16 | Disposition: A | Discharge status: Home / Self Care | Payer: BC Managed Care – PPO | Source: Ambulatory Visit | Attending: Orthopaedic Surgery | Admitting: Orthopaedic Surgery

## 2015-12-15 DIAGNOSIS — S42201A Unspecified fracture of upper end of right humerus, initial encounter for closed fracture: Principal | ICD-10-CM | POA: Insufficient documentation

## 2015-12-15 DIAGNOSIS — M25511 Pain in right shoulder: Secondary | ICD-10-CM | POA: Diagnosis present

## 2015-12-15 DIAGNOSIS — Z419 Encounter for procedure for purposes other than remedying health state, unspecified: Secondary | ICD-10-CM

## 2015-12-15 DIAGNOSIS — I10 Essential (primary) hypertension: Secondary | ICD-10-CM | POA: Insufficient documentation

## 2015-12-15 DIAGNOSIS — S42209A Unspecified fracture of upper end of unspecified humerus, initial encounter for closed fracture: Secondary | ICD-10-CM | POA: Diagnosis present

## 2015-12-15 DIAGNOSIS — W009XXA Unspecified fall due to ice and snow, initial encounter: Secondary | ICD-10-CM | POA: Diagnosis not present

## 2015-12-15 DIAGNOSIS — E7800 Pure hypercholesterolemia, unspecified: Secondary | ICD-10-CM | POA: Diagnosis not present

## 2015-12-15 DIAGNOSIS — S42291A Other displaced fracture of upper end of right humerus, initial encounter for closed fracture: Secondary | ICD-10-CM

## 2015-12-15 HISTORY — DX: Other specified postprocedural states: Z98.890

## 2015-12-15 HISTORY — PX: ORIF HUMERUS FRACTURE: SHX2126

## 2015-12-15 HISTORY — DX: Nausea with vomiting, unspecified: R11.2

## 2015-12-15 HISTORY — DX: Pure hypercholesterolemia, unspecified: E78.00

## 2015-12-15 SURGERY — OPEN REDUCTION INTERNAL FIXATION (ORIF) PROXIMAL HUMERUS FRACTURE
Anesthesia: General | Laterality: Right

## 2015-12-15 MED ORDER — LIDOCAINE HCL (CARDIAC) 20 MG/ML IV SOLN
INTRAVENOUS | Status: DC | PRN
  Administered 2015-12-15: 100 mg via INTRAVENOUS

## 2015-12-15 MED ORDER — STERILE WATER FOR INJECTION IJ SOLN
INTRAMUSCULAR | Status: AC
  Filled 2015-12-15: qty 20

## 2015-12-15 MED ORDER — PROPOFOL 10 MG/ML IV BOLUS
INTRAVENOUS | Status: DC | PRN
  Administered 2015-12-15: 130 mg via INTRAVENOUS

## 2015-12-15 MED ORDER — METHOCARBAMOL 500 MG PO TABS: 500.0000 mg | ORAL_TABLET | Freq: Four times a day (QID) | ORAL | Status: DC | PRN

## 2015-12-15 MED ORDER — HYDROMORPHONE HCL 1 MG/ML IJ SOLN: 1.0000 mg | INTRAMUSCULAR | Status: DC | PRN

## 2015-12-15 MED ORDER — FENTANYL CITRATE (PF) 250 MCG/5ML IJ SOLN
INTRAMUSCULAR | Status: DC | PRN
  Administered 2015-12-15: 150 ug via INTRAVENOUS

## 2015-12-15 MED ORDER — METHOCARBAMOL 1000 MG/10ML IJ SOLN: 500.0000 mg | Freq: Four times a day (QID) | INTRAVENOUS | Status: DC | PRN

## 2015-12-15 MED ORDER — METOCLOPRAMIDE HCL 5 MG PO TABS: 5.0000 mg | ORAL_TABLET | Freq: Three times a day (TID) | ORAL | Status: DC | PRN

## 2015-12-15 MED ORDER — VECURONIUM BROMIDE 10 MG IV SOLR
INTRAVENOUS | Status: AC
  Filled 2015-12-15: qty 10

## 2015-12-15 MED ORDER — ADULT MULTIVITAMIN W/MINERALS CH
1.0000 | ORAL_TABLET | Freq: Every day | ORAL | Status: DC
  Administered 2015-12-16: 1 via ORAL
  Filled 2015-12-15: qty 1

## 2015-12-15 MED ORDER — LACTATED RINGERS IV SOLN
INTRAVENOUS | Status: DC
  Administered 2015-12-15 (×2): via INTRAVENOUS

## 2015-12-15 MED ORDER — ONDANSETRON HCL 4 MG/2ML IJ SOLN
INTRAMUSCULAR | Status: DC | PRN
  Administered 2015-12-15: 4 mg via INTRAVENOUS

## 2015-12-15 MED ORDER — CEFAZOLIN SODIUM 1-5 GM-% IV SOLN
1.0000 g | Freq: Four times a day (QID) | INTRAVENOUS | Status: DC
  Administered 2015-12-16 (×2): 1 g via INTRAVENOUS
  Filled 2015-12-15 (×3): qty 50

## 2015-12-15 MED ORDER — 0.9 % SODIUM CHLORIDE (POUR BTL) OPTIME
TOPICAL | Status: DC | PRN
  Administered 2015-12-15: 1000 mL

## 2015-12-15 MED ORDER — SUGAMMADEX SODIUM 200 MG/2ML IV SOLN
INTRAVENOUS | Status: AC
  Filled 2015-12-15: qty 2

## 2015-12-15 MED ORDER — VECURONIUM BROMIDE 10 MG IV SOLR
INTRAVENOUS | Status: DC | PRN
  Administered 2015-12-15: 1 mg via INTRAVENOUS

## 2015-12-15 MED ORDER — FENTANYL CITRATE (PF) 250 MCG/5ML IJ SOLN
INTRAMUSCULAR | Status: AC
  Filled 2015-12-15: qty 5

## 2015-12-15 MED ORDER — ACETAMINOPHEN 650 MG RE SUPP: 650.0000 mg | Freq: Four times a day (QID) | RECTAL | Status: DC | PRN

## 2015-12-15 MED ORDER — ROCURONIUM BROMIDE 100 MG/10ML IV SOLN
INTRAVENOUS | Status: DC | PRN
  Administered 2015-12-15: 50 mg via INTRAVENOUS

## 2015-12-15 MED ORDER — METOCLOPRAMIDE HCL 5 MG/ML IJ SOLN: 5.0000 mg | Freq: Three times a day (TID) | INTRAMUSCULAR | Status: DC | PRN

## 2015-12-15 MED ORDER — ONDANSETRON HCL 4 MG/2ML IJ SOLN: 4.0000 mg | Freq: Four times a day (QID) | INTRAMUSCULAR | Status: DC | PRN

## 2015-12-15 MED ORDER — SODIUM CHLORIDE 0.9 % IV SOLN
INTRAVENOUS | Status: DC
  Administered 2015-12-15: 23:00:00 via INTRAVENOUS

## 2015-12-15 MED ORDER — ATORVASTATIN CALCIUM 40 MG PO TABS: 40.0000 mg | ORAL_TABLET | Freq: Every evening | ORAL | Status: DC

## 2015-12-15 MED ORDER — ONDANSETRON HCL 4 MG/2ML IJ SOLN
INTRAMUSCULAR | Status: AC
  Filled 2015-12-15: qty 2

## 2015-12-15 MED ORDER — PROPOFOL 10 MG/ML IV BOLUS
INTRAVENOUS | Status: AC
  Filled 2015-12-15: qty 20

## 2015-12-15 MED ORDER — ONDANSETRON HCL 4 MG/2ML IJ SOLN: 4.0000 mg | Freq: Once | INTRAMUSCULAR | Status: DC | PRN

## 2015-12-15 MED ORDER — PHENYLEPHRINE HCL 10 MG/ML IJ SOLN
10.0000 mg | INTRAVENOUS | Status: DC | PRN
  Administered 2015-12-15: 35 ug/min via INTRAVENOUS

## 2015-12-15 MED ORDER — DIPHENHYDRAMINE HCL 12.5 MG/5ML PO ELIX: 12.5000 mg | ORAL_SOLUTION | ORAL | Status: DC | PRN

## 2015-12-15 MED ORDER — OXYCODONE HCL 5 MG PO TABS
5.0000 mg | ORAL_TABLET | ORAL | Status: DC | PRN
  Filled 2015-12-15: qty 2

## 2015-12-15 MED ORDER — HYDROMORPHONE HCL 1 MG/ML IJ SOLN: 0.5000 mg | INTRAMUSCULAR | Status: DC | PRN

## 2015-12-15 MED ORDER — FENTANYL CITRATE (PF) 100 MCG/2ML IJ SOLN
INTRAMUSCULAR | Status: AC
  Administered 2015-12-15: 100 ug
  Filled 2015-12-15: qty 2

## 2015-12-15 MED ORDER — ONDANSETRON HCL 4 MG PO TABS: 4.0000 mg | ORAL_TABLET | Freq: Four times a day (QID) | ORAL | Status: DC | PRN

## 2015-12-15 MED ORDER — SUGAMMADEX SODIUM 200 MG/2ML IV SOLN
INTRAVENOUS | Status: DC | PRN
  Administered 2015-12-15: 200 mg via INTRAVENOUS

## 2015-12-15 MED ORDER — ACETAMINOPHEN 325 MG PO TABS: 650.0000 mg | ORAL_TABLET | Freq: Four times a day (QID) | ORAL | Status: DC | PRN

## 2015-12-15 SURGICAL SUPPLY — 62 items
APL SKNCLS STERI-STRIP NONHPOA (GAUZE/BANDAGES/DRESSINGS) ×1
BENZOIN TINCTURE PRP APPL 2/3 (GAUZE/BANDAGES/DRESSINGS) ×2 IMPLANT
BIT DRILL 3.2 (BIT) ×3
BIT DRILL 3.2XCALB NS DISP (BIT) IMPLANT
BIT DRILL CALIBRATED 2.7 (BIT) ×1 IMPLANT
BIT DRILL CALIBRATED 2.7MM (BIT) ×1
BIT DRL 3.2XCALB NS DISP (BIT) ×1
CLOSURE STERI-STRIP 1/4X4 (GAUZE/BANDAGES/DRESSINGS) ×2 IMPLANT
COVER SURGICAL LIGHT HANDLE (MISCELLANEOUS) ×2 IMPLANT
DRAPE C-ARM 42X72 X-RAY (DRAPES) ×3 IMPLANT
DRAPE IMP U-DRAPE 54X76 (DRAPES) ×3 IMPLANT
DRAPE INCISE IOBAN 66X45 STRL (DRAPES) ×1 IMPLANT
DRAPE U-SHAPE 47X51 STRL (DRAPES) ×3 IMPLANT
DRSG AQUACEL AG ADV 3.5X14 (GAUZE/BANDAGES/DRESSINGS) ×2 IMPLANT
DRSG PAD ABDOMINAL 8X10 ST (GAUZE/BANDAGES/DRESSINGS) ×6 IMPLANT
ELECT REM PT RETURN 9FT ADLT (ELECTROSURGICAL) ×3
ELECTRODE REM PT RTRN 9FT ADLT (ELECTROSURGICAL) ×1 IMPLANT
GAUZE SPONGE 4X4 12PLY STRL (GAUZE/BANDAGES/DRESSINGS) ×3 IMPLANT
GAUZE XEROFORM 1X8 LF (GAUZE/BANDAGES/DRESSINGS) ×3 IMPLANT
GLOVE BIO SURGEON STRL SZ 6.5 (GLOVE) ×1 IMPLANT
GLOVE BIO SURGEON STRL SZ8 (GLOVE) ×3 IMPLANT
GLOVE BIO SURGEONS STRL SZ 6.5 (GLOVE) ×1
GLOVE BIOGEL PI IND STRL 6.5 (GLOVE) IMPLANT
GLOVE BIOGEL PI IND STRL 8 (GLOVE) ×2 IMPLANT
GLOVE BIOGEL PI INDICATOR 6.5 (GLOVE) ×2
GLOVE BIOGEL PI INDICATOR 8 (GLOVE) ×4
GLOVE ORTHO TXT STRL SZ7.5 (GLOVE) ×3 IMPLANT
GOWN STRL REUS W/ TWL LRG LVL3 (GOWN DISPOSABLE) ×1 IMPLANT
GOWN STRL REUS W/ TWL XL LVL3 (GOWN DISPOSABLE) ×4 IMPLANT
GOWN STRL REUS W/TWL LRG LVL3 (GOWN DISPOSABLE) ×3
GOWN STRL REUS W/TWL XL LVL3 (GOWN DISPOSABLE) ×6
K-WIRE 2X5 SS THRDED S3 (WIRE) ×6
KIT BASIN OR (CUSTOM PROCEDURE TRAY) ×3 IMPLANT
KIT ROOM TURNOVER OR (KITS) ×3 IMPLANT
KWIRE 2X5 SS THRDED S3 (WIRE) IMPLANT
MANIFOLD NEPTUNE II (INSTRUMENTS) ×3 IMPLANT
NEEDLE 22X1 1/2 (OR ONLY) (NEEDLE) IMPLANT
NS IRRIG 1000ML POUR BTL (IV SOLUTION) ×3 IMPLANT
PACK SHOULDER (CUSTOM PROCEDURE TRAY) ×3 IMPLANT
PACK UNIVERSAL I (CUSTOM PROCEDURE TRAY) ×3 IMPLANT
PAD ARMBOARD 7.5X6 YLW CONV (MISCELLANEOUS) ×6 IMPLANT
PEG LOCKING 3.2X 28MM (Peg) ×2 IMPLANT
PEG LOCKING 3.2X32 (Peg) ×4 IMPLANT
PEG LOCKING 3.2X34 (Screw) ×4 IMPLANT
PEG LOCKING 3.2X40 (Peg) ×2 IMPLANT
PEG LOCKING 3.2X42 (Screw) ×2 IMPLANT
PLATE PROX HUM LO R 7H 133 (Plate) ×2 IMPLANT
SCREW CORTICAL LOW PROF 3.5X32 (Screw) ×2 IMPLANT
SCREW LOW PROF TIS 3.5X28MM (Screw) ×6 IMPLANT
SCREW LP NL T15 3.5X24 (Screw) ×4 IMPLANT
SPONGE LAP 4X18 X RAY DECT (DISPOSABLE) ×2 IMPLANT
STAPLER VISISTAT 35W (STAPLE) ×3 IMPLANT
SUCTION FRAZIER TIP 10 FR DISP (SUCTIONS) ×3 IMPLANT
SUT VIC AB 0 CT1 27 (SUTURE) ×3
SUT VIC AB 0 CT1 27XBRD ANBCTR (SUTURE) ×2 IMPLANT
SUT VIC AB 2-0 CT1 27 (SUTURE) ×3
SUT VIC AB 2-0 CT1 TAPERPNT 27 (SUTURE) ×2 IMPLANT
SYR CONTROL 10ML LL (SYRINGE) IMPLANT
TOWEL OR 17X24 6PK STRL BLUE (TOWEL DISPOSABLE) ×3 IMPLANT
TOWEL OR 17X26 10 PK STRL BLUE (TOWEL DISPOSABLE) ×3 IMPLANT
WATER STERILE IRR 1000ML POUR (IV SOLUTION) ×1 IMPLANT
YANKAUER SUCT BULB TIP NO VENT (SUCTIONS) ×3 IMPLANT

## 2015-12-15 NOTE — Brief Op Note (Signed)
12/15/2015  7:44 PM  PATIENT:  Margaret Fleming  70 y.o. female  PRE-OPERATIVE DIAGNOSIS:  right proximal humerus fracture  POST-OPERATIVE DIAGNOSIS:  right proximal humerus fracture  PROCEDURE:  Procedure(s): OPEN REDUCTION INTERNAL FIXATION (ORIF) RIGHT PROXIMAL HUMERUS FRACTURE (Right)  SURGEON:  Surgeon(s) and Role:    * Kathryne Hitchhristopher Y Blackman, MD - Primary  PHYSICIAN ASSISTANT: Rexene EdisonGil Clark, PA-C  ANESTHESIA:   regional and general  EBL:  Total I/O In: 200 [I.V.:200] Out: 50 [Blood:50]  COUNTS:  YES  TOURNIQUET:  * No tourniquets in log *  DICTATION: .Other Dictation: Dictation Number [id number]  PLAN OF CARE: Admit for overnight observation  PATIENT DISPOSITION:  PACU - hemodynamically stable.   Delay start of Pharmacological VTE agent (>24hrs) due to surgical blood loss or risk of bleeding: no

## 2015-12-15 NOTE — Anesthesia Postprocedure Evaluation (Signed)
Anesthesia Post Note  Patient: Margaret Fleming  Procedure(s) Performed: Procedure(s) (LRB): OPEN REDUCTION INTERNAL FIXATION (ORIF) RIGHT PROXIMAL HUMERUS FRACTURE (Right)  Patient location during evaluation: PACU Anesthesia Type: General Level of consciousness: awake and alert Pain management: pain level controlled Vital Signs Assessment: post-procedure vital signs reviewed and stable Respiratory status: spontaneous breathing, nonlabored ventilation, respiratory function stable and patient connected to nasal cannula oxygen Cardiovascular status: blood pressure returned to baseline and stable Postop Assessment: no signs of nausea or vomiting Anesthetic complications: no    Last Vitals:  Filed Vitals:   12/15/15 2045 12/15/15 2121  BP:  129/66  Pulse: 82 83  Temp:  36.9 C  Resp: 13 14    Last Pain:  Filed Vitals:   12/15/15 2123  PainSc: 0-No pain                 Aman Bonet DAVID

## 2015-12-15 NOTE — H&P (Signed)
Margaret Fleming is an 70 y.o. female.   Chief Complaint:   Right shoulder pain with known proximal humerus fracture HPI:   70 yo female who accidentally slipped on ice this past weekend injuring her right dominant shoulder.  She sustained a comminuted right proximal humerus fracture and now presents for definitive fixation of this fracture.  She understands the reasoning behind the recommendation for surgery as well as the risks and benefits involved.  Past Medical History  Diagnosis Date  . PONV (postoperative nausea and vomiting)   . High cholesterol     Past Surgical History  Procedure Laterality Date  . Ovarian cyst surgery    . Eye surgery    . Colonoscopy      Family History  Problem Relation Age of Onset  . Heart failure Father    Social History:  reports that she has never smoked. She has never used smokeless tobacco. She reports that she drinks alcohol. She reports that she does not use illicit drugs.  Allergies: No Known Allergies  No prescriptions prior to admission    No results found for this or any previous visit (from the past 48 hour(s)). No results found.  Review of Systems  All other systems reviewed and are negative.   Height 5\' 4"  (1.626 m), weight 56.7 kg (125 lb). Physical Exam  Constitutional: She is oriented to person, place, and time. She appears well-developed and well-nourished.  HENT:  Head: Normocephalic and atraumatic.  Eyes: EOM are normal. Pupils are equal, round, and reactive to light.  Neck: Normal range of motion. Neck supple.  Cardiovascular: Normal rate and regular rhythm.   Respiratory: Effort normal and breath sounds normal.  GI: Soft. Bowel sounds are normal.  Musculoskeletal:       Right shoulder: She exhibits decreased range of motion, tenderness, bony tenderness, swelling, deformity and decreased strength.  Neurological: She is alert and oriented to person, place, and time.  Skin: Skin is warm and dry.  Psychiatric: She has a  normal mood and affect.     Assessment/Plan Right shoulder with 3 to 4 part proximal humerus fracture 1)  To the OR today for open reduction/internal fixation of her right proximal humerus fracture followed by overnight admission for IV antibiotics, pain control, and therapy.  Zakhari Fogel Y 12/15/2015, 3:05 PM

## 2015-12-15 NOTE — Transfer of Care (Signed)
Immediate Anesthesia Transfer of Care Note  Patient: Margaret Fleming  Procedure(s) Performed: Procedure(s): OPEN REDUCTION INTERNAL FIXATION (ORIF) RIGHT PROXIMAL HUMERUS FRACTURE (Right)  Patient Location: PACU  Anesthesia Type:GA combined with regional for post-op pain  Level of Consciousness: awake, alert , oriented and patient cooperative  Airway & Oxygen Therapy: Patient Spontanous Breathing and Patient connected to nasal cannula oxygen  Post-op Assessment: Report given to RN, Post -op Vital signs reviewed and stable and Patient moving all extremities  Post vital signs: Reviewed and stable  Last Vitals:  Filed Vitals:   12/15/15 1720 12/15/15 1725  BP: 168/82 158/67  Pulse: 100 102  Temp:    Resp: 20 20    Complications: No apparent anesthesia complications

## 2015-12-15 NOTE — Anesthesia Procedure Notes (Addendum)
Anesthesia Regional Block:  Interscalene brachial plexus block  Pre-Anesthetic Checklist: ,, timeout performed, Correct Patient, Correct Site, Correct Laterality, Correct Procedure, Correct Position, site marked, Risks and benefits discussed,  Surgical consent,  Pre-op evaluation,  At surgeon's request and post-op pain management  Laterality: Right  Prep: chloraprep and alcohol swabs       Needles:  Injection technique: Single-shot  Needle Type: Stimulator Needle - 40          Additional Needles:  Procedures: nerve stimulator Interscalene brachial plexus block  Nerve Stimulator or Paresthesia:  Response: 0.5 mA, 0.1 ms, 3 cm  Additional Responses:   Narrative:  Start time: 12/15/2015 5:40 PM End time: 12/15/2015 5:45 PM Injection made incrementally with aspirations every 5 mL.  Performed by: Personally  Anesthesiologist: Laverle HobbySMITH, GREGORY  Additional Notes: Pt accepts procedure w/ risks. 20 cc 0.5% Marcaine w/ epi w/o difficulty or discomfort. Pt tolerated well. GES   Procedure Name: Intubation Date/Time: 12/15/2015 6:24 PM Performed by: Jerilee HohMUMM, Skylah Delauter N Pre-anesthesia Checklist: Patient identified, Emergency Drugs available, Suction available and Patient being monitored Patient Re-evaluated:Patient Re-evaluated prior to inductionOxygen Delivery Method: Circle system utilized Preoxygenation: Pre-oxygenation with 100% oxygen Intubation Type: IV induction Ventilation: Mask ventilation without difficulty Laryngoscope Size: Mac and 3 Grade View: Grade I Tube type: Oral Tube size: 7.0 mm Number of attempts: 1 Airway Equipment and Method: Stylet Placement Confirmation: ETT inserted through vocal cords under direct vision,  positive ETCO2 and breath sounds checked- equal and bilateral Secured at: 21 cm Tube secured with: Tape Dental Injury: Teeth and Oropharynx as per pre-operative assessment

## 2015-12-15 NOTE — Anesthesia Preprocedure Evaluation (Signed)
Anesthesia Evaluation  Patient identified by MRN, date of birth, ID band Patient awake    Reviewed: Allergy & Precautions, NPO status , Patient's Chart, lab work & pertinent test results  History of Anesthesia Complications (+) PONV  Airway Mallampati: I  TM Distance: >3 FB     Dental   Pulmonary    Pulmonary exam normal        Cardiovascular hypertension, Normal cardiovascular exam     Neuro/Psych    GI/Hepatic   Endo/Other    Renal/GU      Musculoskeletal   Abdominal   Peds  Hematology   Anesthesia Other Findings   Reproductive/Obstetrics                             Anesthesia Physical Anesthesia Plan  ASA: II  Anesthesia Plan: General   Post-op Pain Management:    Induction: Intravenous  Airway Management Planned: Oral ETT  Additional Equipment:   Intra-op Plan:   Post-operative Plan: Extubation in OR  Informed Consent: I have reviewed the patients History and Physical, chart, labs and discussed the procedure including the risks, benefits and alternatives for the proposed anesthesia with the patient or authorized representative who has indicated his/her understanding and acceptance.     Plan Discussed with: CRNA, Anesthesiologist and Surgeon  Anesthesia Plan Comments:         Anesthesia Quick Evaluation

## 2015-12-16 DIAGNOSIS — S42201A Unspecified fracture of upper end of right humerus, initial encounter for closed fracture: Secondary | ICD-10-CM | POA: Diagnosis not present

## 2015-12-16 NOTE — Progress Notes (Signed)
Discharge instructions given and patient verbalized understanding. 

## 2015-12-16 NOTE — Discharge Instructions (Signed)
Come out of your sling as comfort allows. Ice your shoulder as needed. You can get your current dressing wet daily in the shower. No heavy lifting with your right arm and no overhead activities.

## 2015-12-16 NOTE — Progress Notes (Signed)
Patient ID: Margaret Fleming, female   DOB: 01/15/1946, 70 y.o.   MRN: [id number] Doing well.  Comfortable.  NVI.  Dressing with scant drainage.  Can go home today.

## 2015-12-16 NOTE — Evaluation (Signed)
Occupational Therapy Evaluation Patient Details Name: Margaret Fleming MRN: [id number] DOB: 02/10/1946 Today's Date: 12/16/2015    History of Present Illness 70 yo female s/p ORIF humerus by Dr Magnus Ivanblackman . Pt s/p fall on ice and now surgerical repair.    Clinical Impression   Patient evaluated by Occupational Therapy with no further acute OT needs identified. All education has been completed and the patient has no further questions. See below for any follow-up Occupational Therapy or equipment needs. OT to sign off. Thank you for referral.      Follow Up Recommendations  No OT follow up    Equipment Recommendations  None recommended by OT    Recommendations for Other Services       Precautions / Restrictions Precautions Precautions: Fall;Shoulder Shoulder Interventions: Shoulder sling/immobilizer;At all times;Off for dressing/bathing/exercises Required Braces or Orthoses: Sling Restrictions Weight Bearing Restrictions: Yes RUE Weight Bearing: Non weight bearing Other Position/Activity Restrictions: no R shoulder ROM      Mobility Bed Mobility Overal bed mobility: Modified Independent                Transfers Overall transfer level: Modified independent                    Balance                                            ADL Overall ADL's : Needs assistance/impaired Eating/Feeding: Independent   Grooming: Oral care;Modified independent               Lower Body Dressing: Modified independent   Toilet Transfer: Modified Independent   Toileting- Clothing Manipulation and Hygiene: Modified independent       Functional mobility during ADLs: Modified independent General ADL Comments: Pt able to return demonstrate sink level grooming and LB dressing this session. Pt educated on possible sponge bath needs with no order to shower currently. Pt has a friend that can come help her with bathing if required.      Vision      Perception     Praxis      Pertinent Vitals/Pain Pain Assessment: Faces Faces Pain Scale: Hurts a little bit Pain Location: R shoulder Pain Intervention(s): Monitored during session;Repositioned;Ice applied     Hand Dominance Right   Extremity/Trunk Assessment Upper Extremity Assessment Upper Extremity Assessment: RUE deficits/detail RUE Deficits / Details: s/p ORIF   Lower Extremity Assessment Lower Extremity Assessment: Overall WFL for tasks assessed   Cervical / Trunk Assessment Cervical / Trunk Assessment: Normal   Communication Communication Communication: No difficulties   Cognition Arousal/Alertness: Awake/alert Behavior During Therapy: WFL for tasks assessed/performed Overall Cognitive Status: Within Functional Limits for tasks assessed                     General Comments       Exercises   Other Exercises Other Exercises: Pt able to verbalize and return demo  Elbow AROM, hand AROM and wrist AROM   Shoulder Instructions      Home Living Family/patient expects to be discharged to:: Private residence Living Arrangements: Alone Available Help at Discharge: Friend(s);Available PRN/intermittently Type of Home: House Home Access: Stairs to enter Entrance Stairs-Number of Steps: 2 Entrance Stairs-Rails: None Home Layout: Two level;Bed/bath upstairs Alternate Level Stairs-Number of Steps: flight   Bathroom Shower/Tub: Producer, television/film/videoWalk-in shower   Bathroom Toilet:  Standard     Home Equipment: None          Prior Functioning/Environment Level of Independence: Independent        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[employer]    OT Diagnosis: Acute pain   OT Problem List:     OT Treatment/Interventions:      OT Goals(Current goals can be found in the care plan section) Acute Rehab OT Goals Patient Stated Goal: to go home soon OT Goal Formulation: With patient  OT Frequency: Min 2X/week   Barriers to D/C:            Co-evaluation              End of  Session Equipment Utilized During Treatment: Other (comment) (sling) Nurse Communication: Mobility status;Precautions  Activity Tolerance: Patient tolerated treatment well Patient left: in chair;with call bell/phone within reach   Time: 1610-9604 OT Time Calculation (min): 25 min Charges:  OT General Charges $OT Visit: 1 Procedure OT Evaluation $OT Eval Moderate Complexity: 1 Procedure G-Codes: OT G-codes **NOT FOR INPATIENT CLASS** Functional Assessment Tool Used: clinical judgement Functional Limitation: Self care Self Care Current Status (V4098): At least 1 percent but less than 20 percent impaired, limited or restricted Self Care Goal Status (J1914): At least 1 percent but less than 20 percent impaired, limited or restricted Self Care Discharge Status [phone]): At least 1 percent but less than 20 percent impaired, limited or restricted  Margaret Fleming 12/16/2015, 9:35 AM   Mateo Flow   OTR/L Pager: [phone] Office: [phone] .

## 2015-12-16 NOTE — Discharge Summary (Signed)
Patient ID: Margaret Fleming MRN: [id number] DOB/AGE: June 23, 1946 70 y.o.  Admit date: 12/15/2015 Discharge date: 12/16/2015  Admission Diagnoses:  Principal Problem:   Closed 4-part fracture of proximal end of right humerus Active Problems:   Proximal humerus fracture   Discharge Diagnoses:  Same  Past Medical History  Diagnosis Date  . PONV (postoperative nausea and vomiting)   . High cholesterol     Surgeries: Procedure(s): OPEN REDUCTION INTERNAL FIXATION (ORIF) RIGHT PROXIMAL HUMERUS FRACTURE on 12/15/2015   Consultants:    Discharged Condition: Improved  Hospital Course: Margaret Fleming is an 70 y.o. female who was admitted 12/15/2015 for operative treatment ofClosed 4-part fracture of proximal end of right humerus. Patient has severe unremitting pain that affects sleep, daily activities, and work/hobbies. After pre-op clearance the patient was taken to the operating room on 12/15/2015 and underwent  Procedure(s): OPEN REDUCTION INTERNAL FIXATION (ORIF) RIGHT PROXIMAL HUMERUS FRACTURE.    Patient was given perioperative antibiotics: Anti-infectives    Start     Dose/Rate Route Frequency Ordered Stop   12/16/15 0100  ceFAZolin (ANCEF) IVPB 1 g/50 mL premix     1 g 100 mL/hr over 30 Minutes Intravenous Every 6 hours 12/15/15 2111 12/16/15 1859   12/15/15 1830  ceFAZolin (ANCEF) IVPB 2 g/50 mL premix     2 g 100 mL/hr over 30 Minutes Intravenous To [facility] 12/14/15 1133 12/15/15 1834       Patient was given sequential compression devices, early ambulation, and chemoprophylaxis to prevent DVT.  Patient benefited maximally from hospital stay and there were no complications.    Recent vital signs: Patient Vitals for the past 24 hrs:  BP Temp Temp src Pulse Resp SpO2 Height Weight  12/16/15 0439 101/62 mmHg 98.2 F (36.8 C) Oral 72 16 99 % - -  12/16/15 0100 (!) 112/57 mmHg 98.2 F (36.8 C) Oral 75 16 97 % - -  12/15/15 2121 129/66 mmHg 98.4 F (36.9 C) Oral 83 14  99 % - -  12/15/15 2045 - - - 82 13 99 % - -  12/15/15 2044 - 98 F (36.7 C) - - - - - -  12/15/15 2043 - - - 82 11 99 % - -  12/15/15 2036 131/72 mmHg - - 90 15 100 % - -  12/15/15 2030 - - - 86 12 98 % - -  12/15/15 2021 127/67 mmHg - - 89 13 99 % - -  12/15/15 2015 - - - 92 13 99 % - -  12/15/15 2006 115/84 mmHg 98.2 F (36.8 C) - 94 13 100 % - -  12/15/15 1725 (!) 158/67 mmHg - - (!) 102 20 100 % - -  12/15/15 1720 (!) 168/82 mmHg - - 100 20 100 % - -  12/15/15 1714 (!) 153/73 mmHg - - - - - - -  12/15/15 1614 140/69 mmHg 99 F (37.2 C) Oral 77 16 100 % 5\' 4"  (1.626 m) 56.7 kg (125 lb)     Recent laboratory studies: No results for input(s): WBC, HGB, HCT, PLT, NA, K, CL, CO2, BUN, CREATININE, GLUCOSE, INR, CALCIUM in the last 72 hours.  Invalid input(s): PT, 2   Discharge Medications:     Medication List    TAKE these medications        atorvastatin 40 MG tablet  Commonly known as:  LIPITOR  Take 40 mg by mouth every evening.     ibuprofen 200 MG tablet  Commonly known  as:  ADVIL,MOTRIN  Take 200-400 mg by mouth every 6 (six) hours as needed (pain).     multivitamin with minerals Tabs tablet  Take 1 tablet by mouth daily.     oxyCODONE 5 MG immediate release tablet  Commonly known as:  Oxy IR/ROXICODONE  Take 1-2 tablets (5-10 mg total) by mouth every 4 (four) hours as needed for moderate pain or severe pain.        Diagnostic Studies: Dg Shoulder Right  12/15/2015  CLINICAL DATA:  Right humeral neck fracture.  Subsequent encounter. EXAM: RIGHT SHOULDER - 4 VIEW; DG C-ARM 61-120 MIN COMPARISON:  None. FINDINGS: Four fluoroscopic spot images obtained intraoperatively show placement of fixation plate and screws transfixing a old right humeral neck fracture in near anatomic alignment. IMPRESSION: Internal fixation of right humeral neck fracture in near anatomic alignment. Electronically Signed   By: Myles RosenthalJohn  Stahl M.D.   On: 12/15/2015 20:32   Dg Shoulder  Right  12/10/2015  CLINICAL DATA:  70 year old female with right proximal humerus pain following a fall on the ice EXAM: RIGHT SHOULDER - 2+ VIEW COMPARISON:  None. FINDINGS: Acute comminuted fracture through the surgical neck of the humerus.a the humeral head is fragmented into at least 3 segments. The humeral head remains located with respect to the glenoid. The visualized thorax is unremarkable. Normal bony mineralization. No lytic or blastic osseous lesion. IMPRESSION: 1. Acute and mildly displaced 3 part fracture through the surgical neck of the humerus. 2. Concern for spiral component of the fracture extending down the proximal humeral diaphysis. Electronically Signed   By: Malachy MoanHeath  McCullough M.D.   On: 12/10/2015 10:02   Dg Humerus Right  12/10/2015  CLINICAL DATA:  70 year old female with right proximal arm pain after falling in this known EXAM: RIGHT HUMERUS - 2+ VIEW COMPARISON:  Concurrently obtained radiographs of the shoulder FINDINGS: Acute mildly displaced 3 part fracture of the proximal humerus through the surgical neck of the humerus. A lucency extends in a spiral configuration into the proximal humeral diaphysis consistent with extension of the primary fracture site. Bony mineralization appears to be within normal limits. No lytic or blastic osseous lesion. The visualized thorax is unremarkable. IMPRESSION: Acute mildly displaced 3 part fracture the proximal humerus through the surgical neck with extension of the fracture line into the proximal humeral diaphysis Electronically Signed   By: Malachy MoanHeath  McCullough M.D.   On: 12/10/2015 10:05   Dg C-arm 61-120 Min  12/15/2015  CLINICAL DATA:  Right humeral neck fracture.  Subsequent encounter. EXAM: RIGHT SHOULDER - 4 VIEW; DG C-ARM 61-120 MIN COMPARISON:  None. FINDINGS: Four fluoroscopic spot images obtained intraoperatively show placement of fixation plate and screws transfixing a old right humeral neck fracture in near anatomic alignment.  IMPRESSION: Internal fixation of right humeral neck fracture in near anatomic alignment. Electronically Signed   By: Myles RosenthalJohn  Stahl M.D.   On: 12/15/2015 20:32    Disposition: 01-Home or Self Care      Discharge Instructions    Discharge patient    Complete by:  As directed            Follow-up Information    Follow up with Kathryne HitchBLACKMAN,Ramirez Fullbright Y, MD. Schedule an appointment as soon as possible for a visit in 2 weeks.   Specialty:  Orthopedic Surgery   Contact information:   [address] [place] [address] [phone]        Signed: Kathryne HitchBLACKMAN,Zackry Deines Y 12/16/2015, 10:28 AM

## 2015-12-18 ENCOUNTER — Encounter (HOSPITAL_COMMUNITY): Payer: Self-pay | Admitting: Orthopaedic Surgery

## 2015-12-18 NOTE — Op Note (Signed)
NAMCharlotte Fleming:  Femia, Elaisha                  ACCOUNT NO.:  [id number]  MEDICAL RECORD NO.:  [id number]  LOCATION:                                 FACILITY:  PHYSICIAN:  Vanita PandaChristopher Y. Magnus IvanBlackman, M.D.DATE OF BIRTH:  1946/05/20  DATE OF PROCEDURE:  12/15/2015 DATE OF DISCHARGE:                              OPERATIVE REPORT   PREOPERATIVE DIAGNOSIS:  Right comminuted 3-4 part proximal humerus fracture.  POSTOPERATIVE DIAGNOSIS:  Right comminuted 3-4 part proximal humerus fracture.  PROCEDURE:  Open reduction and internal fixation of right comminuted proximal humerus fracture.  IMPLANTS:  Biomet right 7-hole proximal humeral locking plate.  SURGEON:  Vanita PandaChristopher Y. Magnus IvanBlackman, MD  ASSISTANT:  Richardean CanalGilbert Clark, PA-C  ANESTHESIA: 1. Regional right shoulder block. 2. General.  BLOOD LOSS:  Less than 100 mL.  COMPLICATIONS:  None.  ANTIBIOTICS:  2 g IV Ancef.  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  Unfortunately, she sustained a mechanical fall this past Sunday on [employer].  She sustained a right dominant proximal humerus fracture with significant comminution and looks like they extended down into the shaft of the humerus.  Due to her pain, the nature of the fracture due to the fact that this is dominant arm, and we recommended open reduction and internal fixation.  The risks and benefits of surgery were explained to her in detail and she did understand the reason behind proceeding with surgery.  PROCEDURE DESCRIPTION:  After informed consent was obtained, appropriate right shoulder was marked.  Anesthesia obtained with regional shoulder block.  She was then brought to the operating room, placed supine on the operating table.  General anesthesia was then obtained.  She was then fashioned in a beach chair position with appropriate positioning of the head and neck and padding of the down on nonoperative left arm with appropriate bending  in the waist and knees and palpable pulses in her feet.  Her right shoulder was then prepped and draped with DuraPrep and sterile drapes including a sterile stockinette.  A time-out was called and she was identified as correct patient, correct right shoulder.  I then performed a deltopectoral approach to the shoulder dissecting down to the humerus and carrying this proximally and distally.  We were able to protect appropriate vascular and neurologic structures and then identified the fracture.  It took some time to tease the fracture pieces in the place.  I then chose a 7-hole Biomet proximal humeral locking plate, but we needed to go with a long plate due to the fact that her fracture did seem to extend a little distal.  Once we fashioned the plate in place and temporarily held it with K-wire fixation.  We were able to fix the fracture by bringing the diaphysis/shaft of the humerus to the plate with bicortical nonlocking screws and then locking pegs into the humeral head.  Once we had accomplished our fixation, we put the shoulder through internal external rotation.  We did not feel any grinding and did not feel like any screws that penetrated the head.  We then irrigated the wound,  this was all accomplished under fluoroscopic  guidance as well.  We then irrigated the soft tissue wound with normal saline solution.  We closed the deep tissue over the plate with 0 Vicryl suture followed by 2-0 Vicryl subcutaneous tissue and 3-0 Monocryl subcuticular stitch.  Steri-Strips were placed on the skin and an Aquacel dressing was placed in as well.  She was then put back into a sling, laid back down in supine position, awakened, extubated, and taken to recovery room in stable condition.  All final counts were correct. There were no complications noted.  Of note, Richardean Canal, PA-C assisted in the entire case.  His assistance was crucial for facilitating all aspects of this  case.     Vanita Panda. Magnus Ivan, M.D.     CYB/MEDQ  D:  12/15/2015  T:  12/16/2015  Job:  [id number]

## 2016-07-09 ENCOUNTER — Other Ambulatory Visit (HOSPITAL_COMMUNITY)
Admission: RE | Admit: 2016-07-09 | Discharge: 2016-07-09 | Disposition: A | Discharge status: Home / Self Care | Payer: BC Managed Care – PPO | Source: Ambulatory Visit | Attending: Nurse Practitioner | Admitting: Nurse Practitioner

## 2016-07-09 ENCOUNTER — Other Ambulatory Visit: Payer: Self-pay | Admitting: Nurse Practitioner

## 2016-07-09 DIAGNOSIS — Z1151 Encounter for screening for human papillomavirus (HPV): Secondary | ICD-10-CM | POA: Insufficient documentation

## 2016-07-09 DIAGNOSIS — Z01419 Encounter for gynecological examination (general) (routine) without abnormal findings: Secondary | ICD-10-CM | POA: Insufficient documentation

## 2016-07-11 LAB — CYTOLOGY - PAP

## 2016-12-02 IMAGING — CR DG HUMERUS 2V *R*
2 series · 2 of 2 positions shown · non-contrast
Comparison: Concurrently obtained radiographs of the shoulder

CLINICAL DATA: 69-year-old female with right proximal arm pain
after falling in this known

EXAM:
RIGHT HUMERUS - 2+ VIEW

[w humerus lat right]
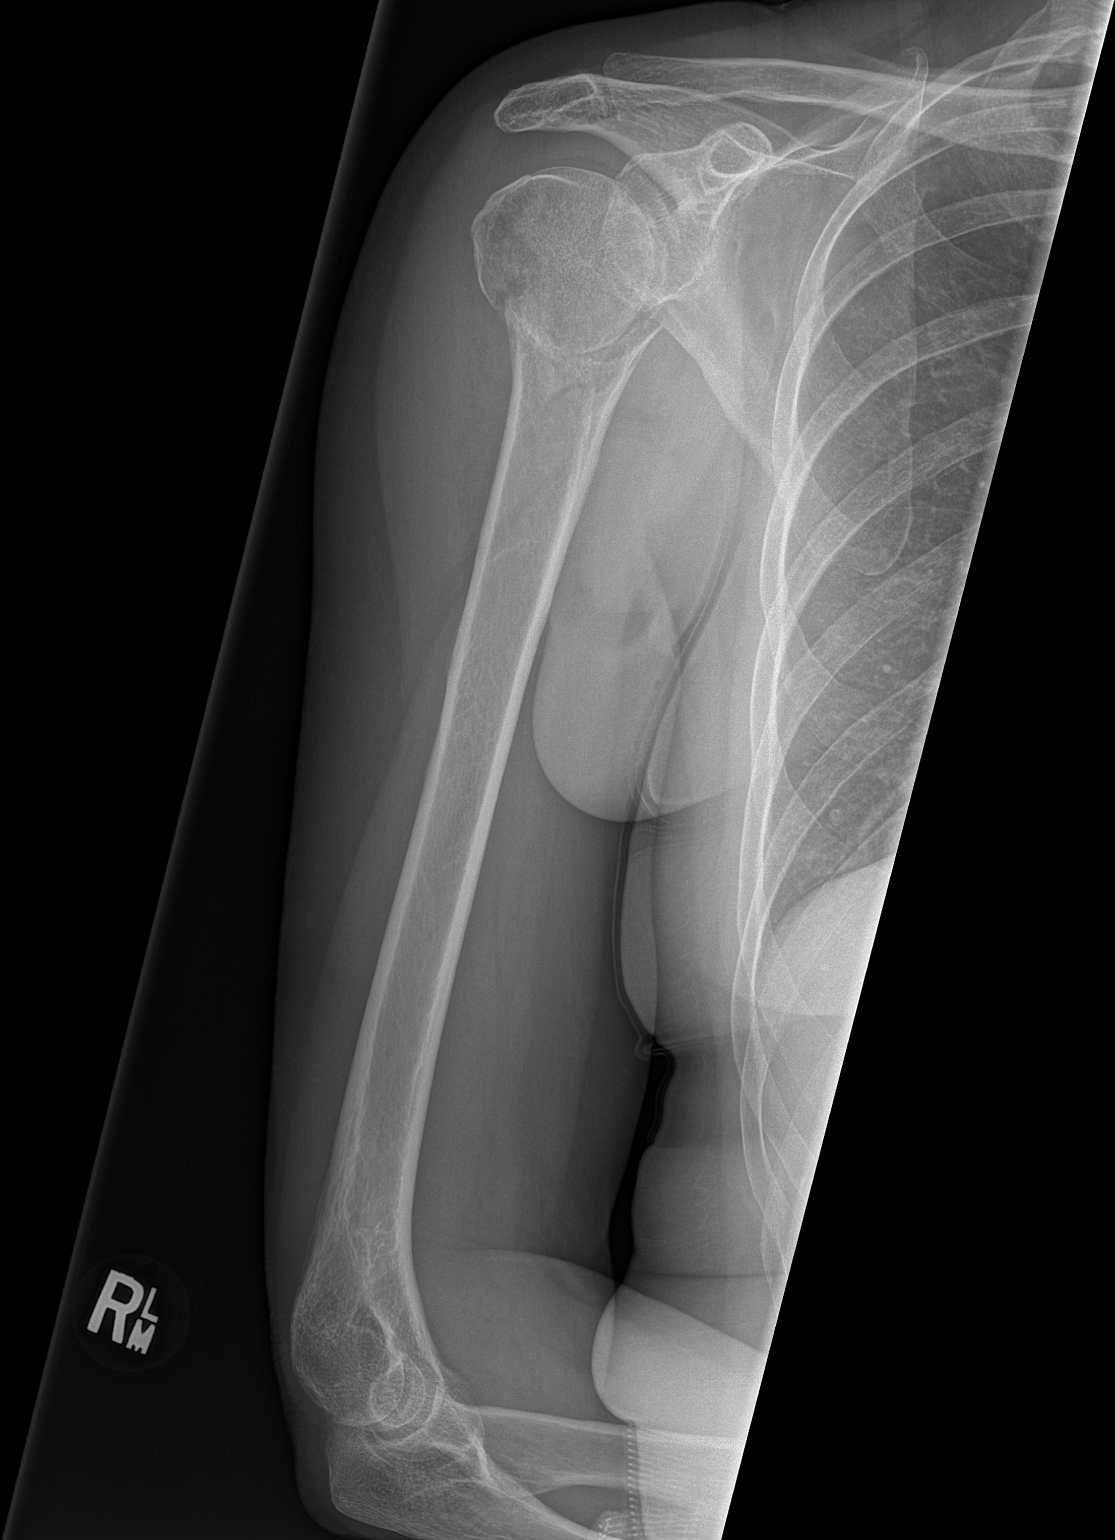

[w humerus ap right]
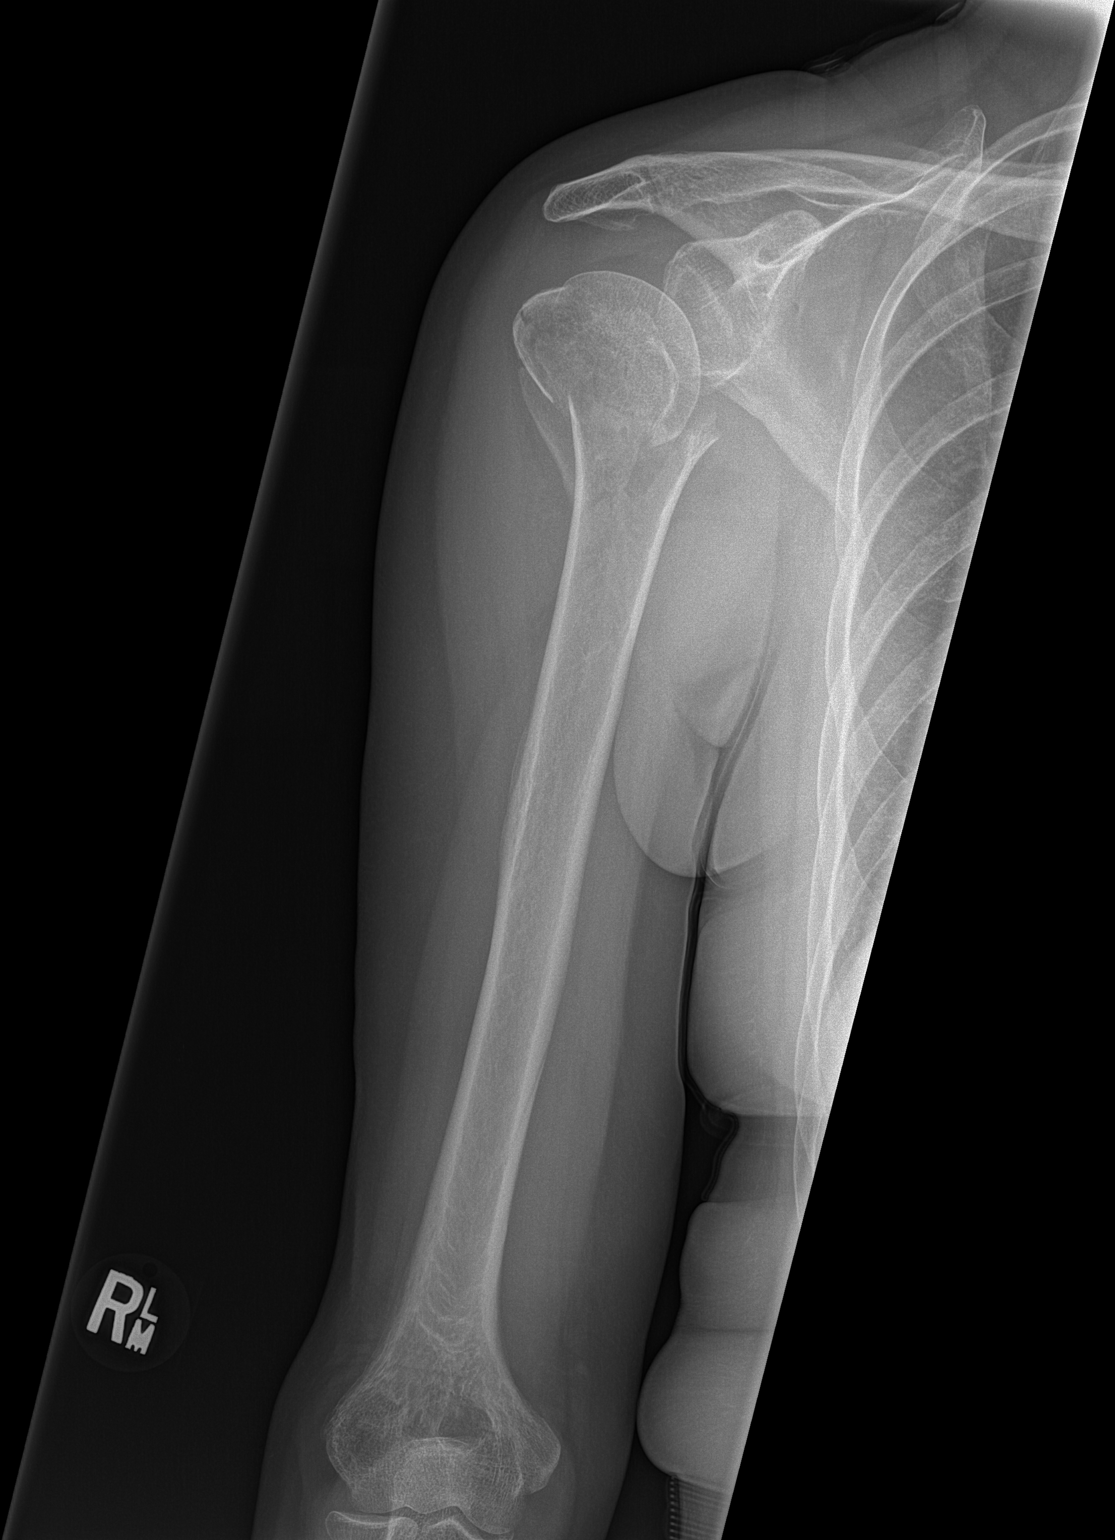

[2 of 2 positions shown; findings below may reference images not displayed]

FINDINGS: Acute mildly displaced 3 part fracture of the proximal humerus
through the surgical neck of the humerus. A lucency extends in a
spiral configuration into the proximal humeral diaphysis consistent
with extension of the primary fracture site. Bony mineralization
appears to be within normal limits. No lytic or blastic osseous
lesion. The visualized thorax is unremarkable.
IMPRESSION: Acute mildly displaced 3 part fracture the proximal humerus through
the surgical neck with extension of the fracture line into the
proximal humeral diaphysis

## 2016-12-07 IMAGING — RF DG C-ARM 61-120 MIN
1 series · 4 of 4 positions shown · non-contrast
Comparison: None.

CLINICAL DATA: Right humeral neck fracture.  Subsequent encounter.

EXAM:
RIGHT SHOULDER - 4 VIEW; DG C-ARM 61-120 MIN

[Series 1: run · 4 of 4 slices shown]
[im 1/4]
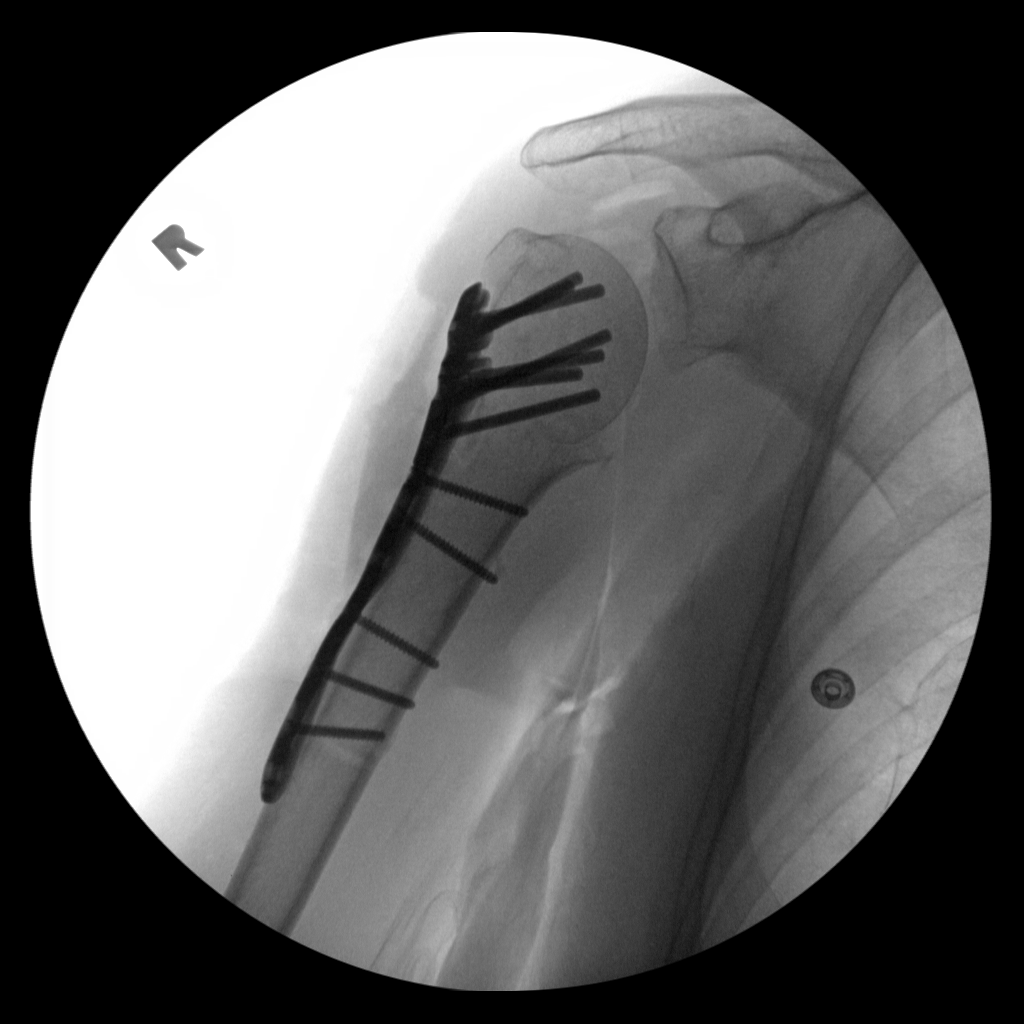
[im 2/4]
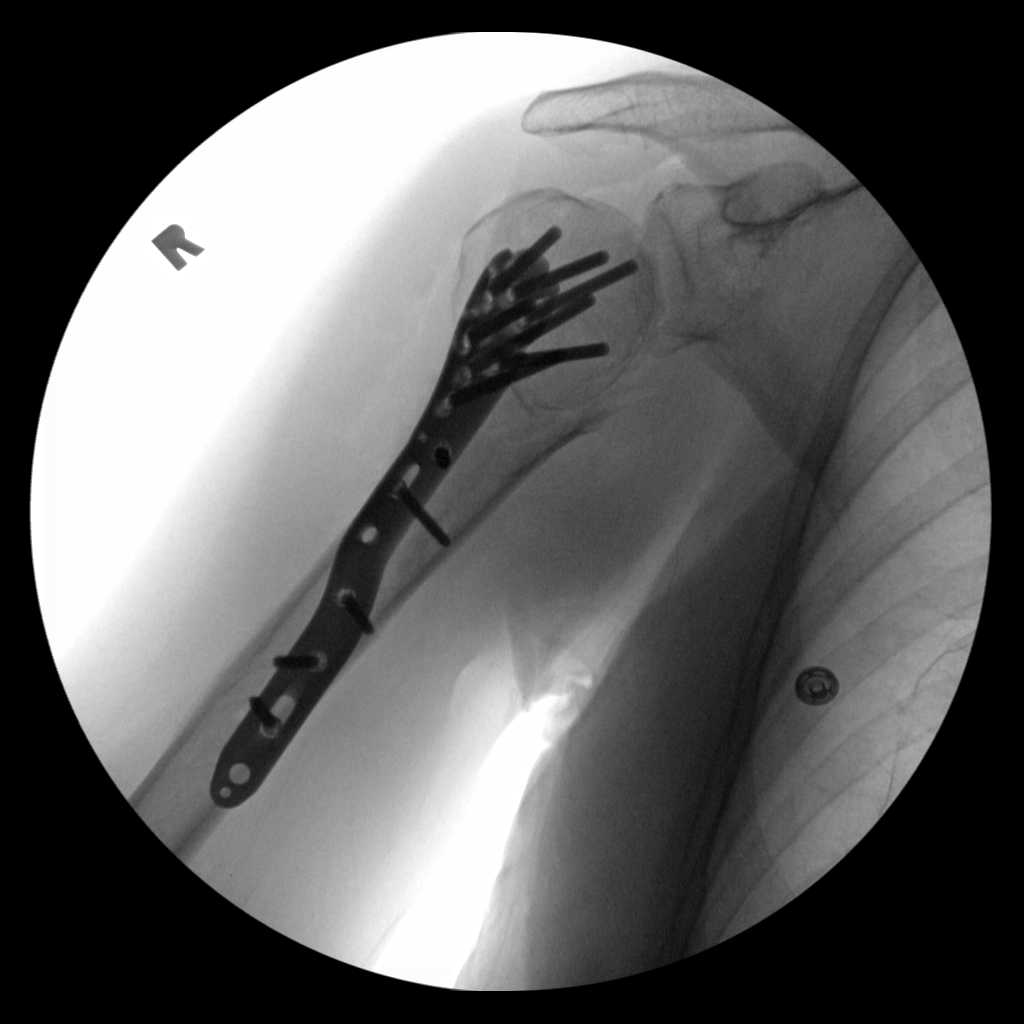
[im 3/4]
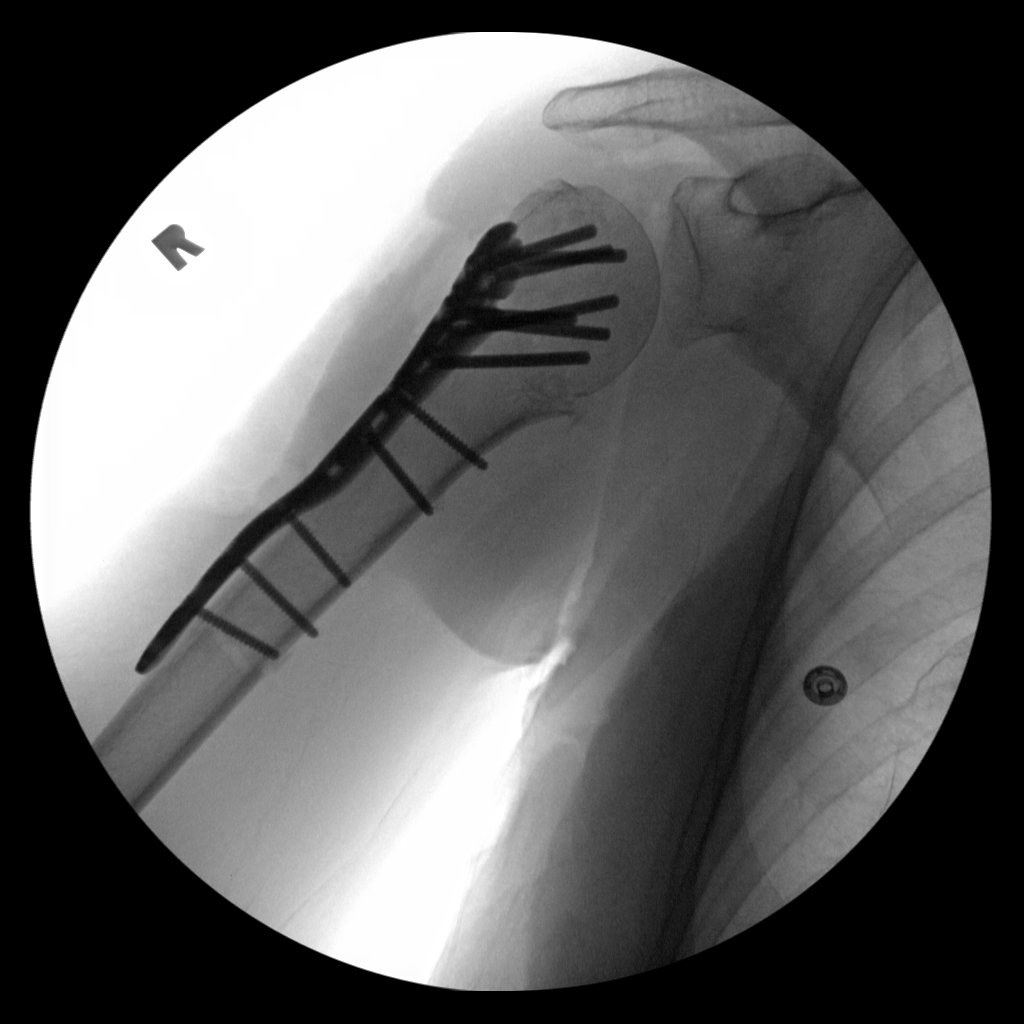
[im 4/4]
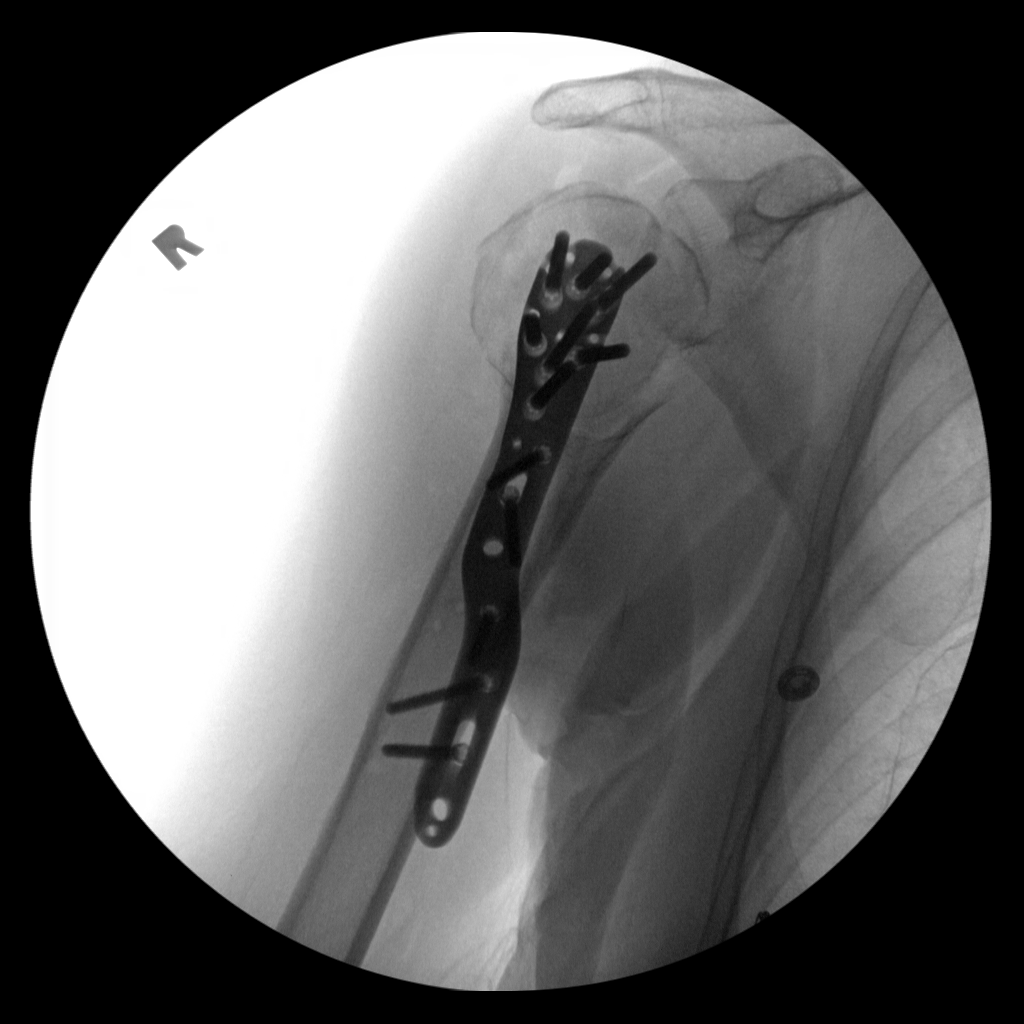

[4 of 4 positions shown; findings below may reference images not displayed]

FINDINGS: Four fluoroscopic spot images obtained intraoperatively show
placement of fixation plate and screws transfixing a old right
humeral neck fracture in near anatomic alignment.
IMPRESSION: Internal fixation of right humeral neck fracture in near anatomic
alignment.

## 2020-01-10 ENCOUNTER — Ambulatory Visit: Payer: BC Managed Care – PPO | Attending: Internal Medicine

## 2020-01-10 DIAGNOSIS — Z23 Encounter for immunization: Secondary | ICD-10-CM | POA: Insufficient documentation

## 2020-01-10 NOTE — Progress Notes (Signed)
   Covid-19 Vaccination Clinic  Name:  Margaret Fleming    MRN: [id number] DOB: Apr 10, 1946  01/10/2020  Margaret Fleming was observed post Covid-19 immunization for 15 minutes without incidence. She was provided with Vaccine Information Sheet and instruction to access the V-Safe system.   Margaret Fleming was instructed to call 911 with any severe reactions post vaccine: Marland Kitchen Difficulty breathing  . Swelling of your face and throat  . A fast heartbeat  . A bad rash all over your body  . Dizziness and weakness    Immunizations Administered    Name Date Dose VIS Date Route   Pfizer COVID-19 Vaccine 01/10/2020  1:38 PM 0.3 mL 11/12/2019 Intramuscular   Manufacturer: [employer], [employer]   Lot: [id number]   NDC: [address]-7

## 2020-01-27 ENCOUNTER — Ambulatory Visit: Payer: BC Managed Care – PPO

## 2020-02-07 ENCOUNTER — Ambulatory Visit: Payer: BC Managed Care – PPO | Attending: Internal Medicine

## 2020-02-07 DIAGNOSIS — Z23 Encounter for immunization: Secondary | ICD-10-CM | POA: Insufficient documentation

## 2020-02-07 NOTE — Progress Notes (Signed)
   Covid-19 Vaccination Clinic  Name:  Margaret Fleming    MRN: [id number] DOB: 08/24/1946  02/07/2020  Margaret Fleming was observed post Covid-19 immunization for 15 minutes without incident. She was provided with Vaccine Information Sheet and instruction to access the V-Safe system.   Margaret Fleming was instructed to call 911 with any severe reactions post vaccine: Marland Kitchen Difficulty breathing  . Swelling of face and throat  . A fast heartbeat  . A bad rash all over body  . Dizziness and weakness   Immunizations Administered    Name Date Dose VIS Date Route   Pfizer COVID-19 Vaccine 02/07/2020  1:07 PM 0.3 mL 11/12/2019 Intramuscular   Manufacturer: [employer], [employer]   Lot: [id number]   NDC: [address]-2

## 2020-09-30 ENCOUNTER — Ambulatory Visit: Payer: BC Managed Care – PPO | Attending: Internal Medicine

## 2020-09-30 DIAGNOSIS — Z23 Encounter for immunization: Secondary | ICD-10-CM

## 2020-09-30 NOTE — Progress Notes (Signed)
   Covid-19 Vaccination Clinic  Name:  Margaret Fleming    MRN: [id number] DOB: 1945/12/29  09/30/2020  [name] was observed post Covid-19 immunization for 15 minutes without incident. She was provided with Vaccine Information Sheet and instruction to access the V-Safe system.   [name] was instructed to call 911 with any severe reactions post vaccine: Marland Kitchen Difficulty breathing  . Swelling of face and throat  . A fast heartbeat  . A bad rash all over body  . Dizziness and weakness
